# Patient Record
Sex: Female | Born: 1970
Health system: Southern US, Community
[De-identification: ages and names within clinical notes are randomized; demographics above are authoritative.]

## PROBLEM LIST (undated history)

## (undated) DIAGNOSIS — T7840XA Allergy, unspecified, initial encounter: Secondary | ICD-10-CM

## (undated) DIAGNOSIS — N83201 Unspecified ovarian cyst, right side: Secondary | ICD-10-CM

## (undated) DIAGNOSIS — E785 Hyperlipidemia, unspecified: Secondary | ICD-10-CM

## (undated) DIAGNOSIS — Z9889 Other specified postprocedural states: Secondary | ICD-10-CM

## (undated) DIAGNOSIS — N83202 Unspecified ovarian cyst, left side: Secondary | ICD-10-CM

## (undated) DIAGNOSIS — N200 Calculus of kidney: Secondary | ICD-10-CM

## (undated) DIAGNOSIS — F419 Anxiety disorder, unspecified: Secondary | ICD-10-CM

## (undated) DIAGNOSIS — M1990 Unspecified osteoarthritis, unspecified site: Secondary | ICD-10-CM

## (undated) DIAGNOSIS — R112 Nausea with vomiting, unspecified: Secondary | ICD-10-CM

## (undated) DIAGNOSIS — K219 Gastro-esophageal reflux disease without esophagitis: Secondary | ICD-10-CM

## (undated) DIAGNOSIS — N289 Disorder of kidney and ureter, unspecified: Secondary | ICD-10-CM

## (undated) HISTORY — PX: LEG SURGERY: SHX1003

## (undated) HISTORY — DX: Hyperlipidemia, unspecified: E78.5

## (undated) HISTORY — PX: COLONOSCOPY: SHX174

## (undated) HISTORY — DX: Unspecified osteoarthritis, unspecified site: M19.90

## (undated) HISTORY — PX: WISDOM TOOTH EXTRACTION: SHX21

## (undated) HISTORY — DX: Other specified postprocedural states: Z98.890

## (undated) HISTORY — PX: ABDOMINAL HYSTERECTOMY: SHX81

## (undated) HISTORY — DX: Gastro-esophageal reflux disease without esophagitis: K21.9

## (undated) HISTORY — DX: Allergy, unspecified, initial encounter: T78.40XA

## (undated) HISTORY — PX: ANKLE SURGERY: SHX546

## (undated) HISTORY — DX: Nausea with vomiting, unspecified: R11.2

## (undated) HISTORY — DX: Anxiety disorder, unspecified: F41.9

## (undated) HISTORY — PX: POLYPECTOMY: SHX149

---

## 2011-10-04 ENCOUNTER — Emergency Department
Admission: EM | Admit: 2011-10-04 | Discharge: 2011-10-04 | Disposition: A | Discharge status: Home / Self Care | Payer: 59 | Source: Home / Self Care | Attending: Family Medicine | Admitting: Family Medicine

## 2011-10-04 ENCOUNTER — Encounter: Payer: Self-pay | Admitting: *Deleted

## 2011-10-04 DIAGNOSIS — J069 Acute upper respiratory infection, unspecified: Secondary | ICD-10-CM

## 2011-10-04 DIAGNOSIS — J029 Acute pharyngitis, unspecified: Secondary | ICD-10-CM

## 2011-10-04 MED ORDER — AMOXICILLIN 875 MG PO TABS: 875.0000 mg | ORAL_TABLET | Freq: Two times a day (BID) | ORAL | Status: DC

## 2011-10-04 MED ORDER — BENZONATATE 200 MG PO CAPS: 200.0000 mg | ORAL_CAPSULE | Freq: Every day | ORAL | Status: DC

## 2011-10-04 NOTE — ED Provider Notes (Signed)
History     CSN: [id number]  Arrival date & time 10/04/11  5784   First MD Initiated Contact with Patient 10/04/11 [phone]      Chief Complaint  Patient presents with  . Sore Throat      HPI Comments:  Pt c/o sore throat and a little cough x last night. Denies fever. She used chloraseptic spray last night with no relief.    The history is provided by the patient.    History reviewed. No pertinent past medical history.  Past Surgical History  Procedure Date  . Abdominal hysterectomy   . Leg surgery     LT    Family History  Problem Relation Age of Onset  . Diabetes Mother   . Hypertension Mother   . COPD Mother   . Hypertension Brother     History  Substance Use Topics  . Smoking status: Never Smoker   . Smokeless tobacco: Not on file  . Alcohol Use: No    OB History    Grav Para Term Preterm Abortions TAB SAB Ect Mult Living                  Review of Systems + sore throat + cough No pleuritic pain No wheezing No nasal congestion No post-nasal drainage No sinus pain/pressure No itchy/red eyes No earache No hemoptysis No SOB No fever, + chills No nausea No vomiting No abdominal pain No diarrhea No urinary symptoms No skin rashes + fatigue No myalgias + headache Used OTC meds without relief  Allergies  Review of patient's allergies indicates no known allergies.  Home Medications   Current Outpatient Rx  Name Route Sig Dispense Refill  . AMOXICILLIN 875 MG PO TABS Oral Take 1 tablet (875 mg total) by mouth 2 (two) times daily. (Rx void after 10/12/11) 20 tablet 0  . BENZONATATE 200 MG PO CAPS Oral Take 1 capsule (200 mg total) by mouth at bedtime. Take as needed for cough 12 capsule 0    BP 127/85  Pulse 78  Temp 97.9 F (36.6 C) (Oral)  Resp 16  Ht 5\' 2"  (1.575 m)  Wt 189 lb (85.73 kg)  BMI 34.57 kg/m2  SpO2 99%  Physical Exam Nursing notes and Vital Signs reviewed. Appearance:  Patient appears healthy, stated age, and in no  acute distress Eyes:  Pupils are equal, round, and reactive to light and accomodation.  Extraocular movement is intact.  Conjunctivae are not inflamed  Ears:  Canals normal.  Tympanic membranes normal.  Nose:  Mildly congested turbinates.  No sinus tenderness.   Pharynx:  Minimal erythema Neck:  Supple.  Tender shotty posterior nodes are palpated bilaterally  Lungs:  Clear to auscultation.  Breath sounds are equal.  Heart:  Regular rate and rhythm without murmurs, rubs, or gallops.  Abdomen:  Nontender without masses or hepatosplenomegaly.  Bowel sounds are present.  No CVA or flank tenderness.  Extremities:  No edema.  No calf tenderness Skin:  No rash present.   ED Course  Procedures  none   Labs Reviewed  POCT RAPID STREP A (OFFICE) negative  STREP A DNA PROBE pending      1. Acute pharyngitis   2. Acute upper respiratory infections of unspecified site:  Suspect viral syndrome.           MDM  There is no evidence of bacterial infection today. Throat culture pending   Treat symptomatically for now  Prescription written for Benzonatate [facility]) to take  at bedtime for night-time cough.  Take Mucinex D (guaifenesin with decongestant) twice daily for congestion.  Increase fluid intake, rest. May use Afrin nasal spray (or generic oxymetazoline) twice daily for about 5 days.  Also recommend using saline nasal spray several times daily and saline nasal irrigation (AYR is a common brand) Stop all antihistamines for now, and other non-prescription cough/cold preparations. May take Ibuprofen 200mg , 4 tabs every 8 hours with food for sore throat, headache, etc. Begin Amoxicillin if not improving about 5 to 7 days or if persistent fever develops (Given a prescription to hold, with an expiration date)  Follow-up with family doctor if not improving 7 to 10 days.        Lattie Haw, MD 10/04/11 [phone]

## 2011-10-04 NOTE — ED Notes (Signed)
Pt c/o sore throat and a little cough x last night. Denies fever. She used chloraseptic spray last night with no relief.

## 2011-10-06 ENCOUNTER — Telehealth: Payer: Self-pay | Admitting: Family Medicine

## 2012-01-22 ENCOUNTER — Encounter: Payer: Self-pay | Admitting: *Deleted

## 2012-01-22 ENCOUNTER — Emergency Department
Admission: EM | Admit: 2012-01-22 | Discharge: 2012-01-22 | Disposition: A | Discharge status: Home / Self Care | Payer: 59 | Source: Home / Self Care | Attending: Family Medicine | Admitting: Family Medicine

## 2012-01-22 DIAGNOSIS — J069 Acute upper respiratory infection, unspecified: Secondary | ICD-10-CM

## 2012-01-22 DIAGNOSIS — M940 Chondrocostal junction syndrome [Tietze]: Secondary | ICD-10-CM

## 2012-01-22 MED ORDER — AZITHROMYCIN 250 MG PO TABS: ORAL_TABLET | ORAL | Status: DC

## 2012-01-22 MED ORDER — BENZONATATE 200 MG PO CAPS: 200.0000 mg | ORAL_CAPSULE | Freq: Every day | ORAL | Status: DC

## 2012-01-22 NOTE — ED Notes (Signed)
Patient c/o dry cough, body aches and ear pain x 3 days. Taken Nyquil. No flu vaccine this year.

## 2012-01-22 NOTE — ED Provider Notes (Signed)
History     CSN: [id number]  Arrival date & time 01/22/12  1057   First MD Initiated Contact with Patient 01/22/12 1113      Chief Complaint  Patient presents with  . Cough     HPI Comments: Patient complains of mild URI symptoms for 5 days.   He has not had influenza immunization for this season.  She also has not had a recent Tdap (about 11 years ago) and works in a [employer].    The history is provided by the patient.    History reviewed. No pertinent past medical history.  Past Surgical History  Procedure Date  . Abdominal hysterectomy   . Leg surgery     LT    Family History  Problem Relation Age of Onset  . Diabetes Mother   . Hypertension Mother   . COPD Mother   . Hypertension Brother     History  Substance Use Topics  . Smoking status: Never Smoker   . Smokeless tobacco: Not on file  . Alcohol Use: No    OB History    Grav Para Term Preterm Abortions TAB SAB Ect Mult Living                  Review of Systems No sore throat + cough No pleuritic pain No wheezing + nasal congestion + post-nasal drainage No sinus pain/pressure No itchy/red eyes ? earache No hemoptysis No SOB No fever/chills No nausea No vomiting No abdominal pain No diarrhea No urinary symptoms No skin rashes + fatigue + mild myalgias + headache Used OTC meds without relief  Allergies  Review of patient's allergies indicates no known allergies.  Home Medications   Current Outpatient Rx  Name  Route  Sig  Dispense  Refill  . AZITHROMYCIN 250 MG PO TABS      Take 2 tabs today; then begin one tab once daily for 4 more days. (Rx void after 01/30/12)   6 each   0   . BENZONATATE 200 MG PO CAPS   Oral   Take 1 capsule (200 mg total) by mouth at bedtime. Take as needed for cough   12 capsule   0     BP 126/85  Pulse 92  Temp 98.3 F (36.8 C) (Oral)  Resp 16  Ht 5\' 2"  (1.575 m)  Wt 187 lb (84.823 kg)  BMI 34.20 kg/m2  SpO2 99%  Physical Exam Nursing  notes and Vital Signs reviewed. Appearance:  Patient appears stated age, and in no acute distress.  Patient is obese (BMI 34.2) Eyes:  Pupils are equal, round, and reactive to light and accomodation.  Extraocular movement is intact.  Conjunctivae are not inflamed  Ears:  Canals normal.  Tympanic membranes normal.  Nose:  Mildly congested turbinates.  No sinus tenderness.  Pharynx:  Normal Neck:  Supple.  Slightly tender shotty posterior nodes are palpated bilaterally  Lungs:  Clear to auscultation.  Breath sounds are equal.  Chest:  Distinct tenderness to palpation over the mid-sternum.  Heart:  Regular rate and rhythm without murmurs, rubs, or gallops.  Abdomen:  Nontender without masses or hepatosplenomegaly.  Bowel sounds are present.  No CVA or flank tenderness.  Extremities:  No edema.  No calf tenderness Skin:  No rash present.   ED Course  Procedures none      1. Acute upper respiratory infections of unspecified site; suspect viral URI   2. Costochondritis, acute  MDM  There is no evidence of bacterial infection today.   Treat symptomatically for now  Prescription written for Benzonatate (Tessalon) to take at bedtime for night-time cough.  Take Mucinex D (guaifenesin with decongestant) twice daily for congestion.  Increase fluid intake, rest. May use Afrin nasal spray (or generic oxymetazoline) twice daily for about 5 days.  Also recommend using saline nasal spray several times daily and saline nasal irrigation (AYR is a common brand) Stop all antihistamines for now, and other non-prescription cough/cold preparations. May take Ibuprofen 200mg , 4 tabs every 8 hours with food for chest/sternum discomfort, body aches, etc. Begin Azithromycin if not improving about 5 days or if persistent fever develops (Given a prescription to hold, with an expiration date)  Follow-up with family doctor if not improving 7 to 10 days. Recommend a Tdap when well.  Recommend a flu shot when  well.          Lattie Haw, MD 01/22/12 [phone]

## 2012-10-01 ENCOUNTER — Emergency Department
Admission: EM | Admit: 2012-10-01 | Discharge: 2012-10-01 | Disposition: A | Discharge status: Home / Self Care | Payer: BC Managed Care – PPO | Source: Home / Self Care | Attending: Emergency Medicine | Admitting: Emergency Medicine

## 2012-10-01 ENCOUNTER — Encounter: Payer: Self-pay | Admitting: Emergency Medicine

## 2012-10-01 DIAGNOSIS — R300 Dysuria: Secondary | ICD-10-CM

## 2012-10-01 HISTORY — DX: Disorder of kidney and ureter, unspecified: N28.9

## 2012-10-01 HISTORY — DX: Unspecified ovarian cyst, right side: N83.201

## 2012-10-01 HISTORY — DX: Unspecified ovarian cyst, left side: N83.202

## 2012-10-01 HISTORY — DX: Calculus of kidney: N20.0

## 2012-10-01 LAB — POCT URINALYSIS DIP (MANUAL ENTRY)
Bilirubin, UA: NEGATIVE
Glucose, UA: NEGATIVE
Ketones, POC UA: NEGATIVE
Spec Grav, UA: 1.015 (ref 1.005–1.03)
Urobilinogen, UA: 0.2 (ref 0–1)

## 2012-10-01 MED ORDER — CIPROFLOXACIN HCL 500 MG PO TABS: 500.0000 mg | ORAL_TABLET | Freq: Two times a day (BID) | ORAL | Status: DC

## 2012-10-01 NOTE — ED Notes (Signed)
Reports intermittent right lower pelvic pain x 5 days; worse when urinating or having bowel movement. History of both kidney stones and ovarian cysts.

## 2012-10-01 NOTE — ED Provider Notes (Signed)
CSN: [id number]     Arrival date & time 10/01/12  1613 History   First MD Initiated Contact with Patient 10/01/12 1624     Chief Complaint  Patient presents with  . Pelvic Pain   (Consider location/radiation/quality/duration/timing/severity/associated sxs/prior Treatment) HPI  42yo WF with right pelvic pain for last 5 days.  She states that when she is going to the bathroom, it tends to be worse, but can happen at other times as well.  Tends to be very sharp and severe intermittently, but other times not hurting at all.  She has had multiple kidney stones in the remote past which were all in the right side but with these she had more back pain.  No dysuria hematuria polyuria, nausea vomiting diarrhea.  She had a hysterectomy in the past but still has her ovaries and her appendix.  Not taking any medicines.  Past Medical History  Diagnosis Date  . Renal disorder   . Renal calculus   . Bilateral ovarian cysts    Past Surgical History  Procedure Laterality Date  . Abdominal hysterectomy    . Leg surgery      LT   Family History  Problem Relation Age of Onset  . Diabetes Mother   . Hypertension Mother   . COPD Mother   . Hypertension Brother    History  Substance Use Topics  . Smoking status: Never Smoker   . Smokeless tobacco: Not on file  . Alcohol Use: No   OB History   Grav Para Term Preterm Abortions TAB SAB Ect Mult Living                 Review of Systems  All other systems reviewed and are negative.    Allergies  Review of patient's allergies indicates no known allergies.  Home Medications   Current Outpatient Rx  Name  Route  Sig  Dispense  Refill  . azithromycin (ZITHROMAX Z-PAK) 250 MG tablet      Take 2 tabs today; then begin one tab once daily for 4 more days. (Rx void after 01/30/12)   6 each   0   . benzonatate (TESSALON) 200 MG capsule   Oral   Take 1 capsule (200 mg total) by mouth at bedtime. Take as needed for cough   12 capsule   0   .  ciprofloxacin (CIPRO) 500 MG tablet   Oral   Take 1 tablet (500 mg total) by mouth 2 (two) times daily.   10 tablet   0    BP 128/81  Pulse 96  Temp(Src) 97.7 F (36.5 C) (Oral)  Resp 16  Ht 5\' 2"  (1.575 m)  Wt 185 lb (83.915 kg)  BMI 33.83 kg/m2  SpO2 100% Physical Exam  Nursing note and vitals reviewed. Constitutional: She is oriented to person, place, and time. She appears well-developed and well-nourished.  HENT:  Head: Normocephalic and atraumatic.  Eyes: No scleral icterus.  Neck: Neck supple.  Cardiovascular: Regular rhythm and normal heart sounds.   Pulmonary/Chest: Effort normal and breath sounds normal. No respiratory distress.  Abdominal: Soft. Normal appearance and bowel sounds are normal. She exhibits no mass. There is no tenderness. There is no rebound, no guarding and no CVA tenderness.  Neurological: She is alert and oriented to person, place, and time.  Skin: Skin is warm and dry.  Psychiatric: She has a normal mood and affect. Her speech is normal.    ED Course  Procedures (including critical care  time) Labs Review Labs Reviewed  POCT URINALYSIS DIP (MANUAL ENTRY)   Imaging Review No results found.  MDM   1. Dysuria    Her symptoms are consistent with a kidney stones.  I gave her some mesh that she can P. through and see if she can catch any stones or sand.  Also gave her prescription for Cipro that she can hold now as I do not see any evidence of infection on her UA.  In fact.  Urinalysis was completely normal with not even trace blood.  Her exam and symptoms are not consistent with appendicitis.  Differential diagnosis also includes groin strain versus other GU abnormality such as ovarian cyst.  I suggested possible imaging, especially a CT abdomen and pelvis to rule out these abnormalities, however patient would like to hold off on that for now.  I understand especially because she is having minimal to no pain while sitting here in clinic.  However if  worsening pain or new symptoms then this would be my suggestion as it would rule out most serious abnormalities such as appendicitis, ovarian cysts, nephrolithiasis, scar tissue, neoplasm.  Patient understands and agrees to this plan and will continue to drink a lot of fluid and use to screen.  We will call patient back in a few days to check on her status.  Marlaine Hind, MD 10/01/12 [phone]

## 2012-10-04 ENCOUNTER — Telehealth: Payer: Self-pay | Admitting: Emergency Medicine

## 2014-03-14 ENCOUNTER — Ambulatory Visit (INDEPENDENT_AMBULATORY_CARE_PROVIDER_SITE_OTHER): Payer: BLUE CROSS/BLUE SHIELD | Admitting: Physician Assistant

## 2014-03-14 ENCOUNTER — Encounter: Payer: Self-pay | Admitting: Physician Assistant

## 2014-03-14 VITALS — BP 122/78 | HR 81 | Ht 62.0 in | Wt 192.0 lb

## 2014-03-14 DIAGNOSIS — Z131 Encounter for screening for diabetes mellitus: Secondary | ICD-10-CM | POA: Diagnosis not present

## 2014-03-14 DIAGNOSIS — F411 Generalized anxiety disorder: Secondary | ICD-10-CM | POA: Diagnosis not present

## 2014-03-14 DIAGNOSIS — F329 Major depressive disorder, single episode, unspecified: Secondary | ICD-10-CM

## 2014-03-14 DIAGNOSIS — Z1239 Encounter for other screening for malignant neoplasm of breast: Secondary | ICD-10-CM

## 2014-03-14 DIAGNOSIS — Z13220 Encounter for screening for lipoid disorders: Secondary | ICD-10-CM | POA: Diagnosis not present

## 2014-03-14 DIAGNOSIS — F32A Depression, unspecified: Secondary | ICD-10-CM

## 2014-03-14 MED ORDER — CITALOPRAM HYDROBROMIDE 10 MG PO TABS: 10.0000 mg | ORAL_TABLET | Freq: Every day | ORAL | Status: DC

## 2014-03-14 NOTE — Progress Notes (Signed)
   Subjective:    Patient ID: Shirley Taylor, female    DOB: August 27, 1970, 44 y.o.   MRN: [id number]  HPI Pt is a 44 yo female who presents to the clinic to establish care. She would like a complete physical but today work in a focus on her anxiety and depression.  .. Active Ambulatory Problems    Diagnosis Date Noted  . Depression 03/14/2014  . Generalized anxiety disorder 03/14/2014   Resolved Ambulatory Problems    Diagnosis Date Noted  . No Resolved Ambulatory Problems   Past Medical History  Diagnosis Date  . Renal disorder   . Renal calculus   . Bilateral ovarian cysts    .Marland Kitchen Family History  Problem Relation Age of Onset  . Diabetes Mother   . Hypertension Mother   . COPD Mother   . Cancer Mother     lung  . Depression Mother   . Hyperlipidemia Mother   . Hypertension Brother   . Alcohol abuse Father    .Marland Kitchen History   Social History  . Marital Status: Married    Spouse Name: N/A  . Number of Children: N/A  . Years of Education: N/A   Occupational History  . Not on file.   Social History Main Topics  . Smoking status: Never Smoker   . Smokeless tobacco: Not on file  . Alcohol Use: Yes  . Drug Use: No  . Sexual Activity: Yes   Other Topics Concern  . Not on file   Social History Narrative   Patient has had arrest 2 years. Her mother died approximately a year ago. Her son has been having a lot of difficulty in school and with his mood. She has decided to homeschool him. He has sudden burst of anger that concern her and make her really frustrated. She's having a lot of difficulty sleeping. She tends to overeat and has little energy. She does admit to feeling down and crying easily. She certainly has notice her worry increase. She denies any suicidal or homicidal thoughts.   Review of Systems  All other systems reviewed and are negative.      Objective:   Physical Exam  Constitutional: She is oriented to person, place, and time. She appears  well-developed and well-nourished.  HENT:  Head: Normocephalic and atraumatic.  Cardiovascular: Normal rate, regular rhythm and normal heart sounds.   Pulmonary/Chest: Effort normal and breath sounds normal. She has no wheezes.  Neurological: She is alert and oriented to person, place, and time.  Psychiatric: She has a normal mood and affect. Her behavior is normal.          Assessment & Plan:  GAD/depression-pH Q9 was 14 and GAD-7 was 7. Will treat with Celexa 10 mg at bedtime. Discuss potential side effects. If any worsening of anxiety or depression please call office. Will follow-up in 4-6 weeks.  I did go ahead and order a mammogram in preparation for complete physical in 4 weeks. I did also go ahead and order her fasting labs.  Elevated blood pressure-rechecked on exam today and had decreased by the end of exam 2 122/78.

## 2014-03-17 ENCOUNTER — Ambulatory Visit (INDEPENDENT_AMBULATORY_CARE_PROVIDER_SITE_OTHER): Payer: BLUE CROSS/BLUE SHIELD

## 2014-03-17 DIAGNOSIS — Z1231 Encounter for screening mammogram for malignant neoplasm of breast: Secondary | ICD-10-CM

## 2014-04-06 ENCOUNTER — Encounter: Payer: Self-pay | Admitting: Physician Assistant

## 2014-04-06 ENCOUNTER — Emergency Department
Admission: EM | Admit: 2014-04-06 | Discharge: 2014-04-06 | Discharge status: Absent without leave | Payer: BLUE CROSS/BLUE SHIELD | Source: Home / Self Care

## 2014-04-06 ENCOUNTER — Ambulatory Visit (INDEPENDENT_AMBULATORY_CARE_PROVIDER_SITE_OTHER): Payer: BLUE CROSS/BLUE SHIELD | Admitting: Physician Assistant

## 2014-04-06 VITALS — BP 127/84 | HR 82 | Wt 188.0 lb

## 2014-04-06 DIAGNOSIS — N898 Other specified noninflammatory disorders of vagina: Secondary | ICD-10-CM | POA: Diagnosis not present

## 2014-04-06 DIAGNOSIS — L298 Other pruritus: Secondary | ICD-10-CM | POA: Diagnosis not present

## 2014-04-06 NOTE — Progress Notes (Signed)
   Subjective:    Patient ID: Shirley Taylor, female    DOB: 03/08/1970, 44 y.o.   MRN: [id number]  HPI Pt is a 44 yo female who presents to the clinic with 3 days of vaginal itching and whitish/yellowish discharge. Hx of yeast and BV. No recent abx or medications. No urinary symptoms. No fever, chills, abdominal pain or flank pain. Not tried anything to make better.    Review of Systems  All other systems reviewed and are negative.      Objective:   Physical Exam  Constitutional: She is oriented to person, place, and time. She appears well-developed.  HENT:  Head: Normocephalic and atraumatic.  Cardiovascular: Normal rate, regular rhythm and normal heart sounds.   Pulmonary/Chest: Effort normal and breath sounds normal.  No CVA tenderness.   Abdominal: Soft. Bowel sounds are normal. There is no tenderness.  Neurological: She is alert and oriented to person, place, and time.  Skin: Skin is dry.  Psychiatric: She has a normal mood and affect. Her behavior is normal.          Assessment & Plan:  Vaginal discharge/itching- stat wet prep ordered. Discussed vingear bath tonight for itching. Will let her know results tomorrow.

## 2014-04-06 NOTE — Patient Instructions (Signed)

## 2014-04-07 ENCOUNTER — Other Ambulatory Visit: Payer: Self-pay | Admitting: Physician Assistant

## 2014-04-07 LAB — WET PREP, GENITAL
Clue Cells Wet Prep HPF POC: NONE SEEN
Trich, Wet Prep: NONE SEEN
WBC, Wet Prep HPF POC: NONE SEEN

## 2014-04-07 MED ORDER — FLUCONAZOLE 150 MG PO TABS: 150.0000 mg | ORAL_TABLET | Freq: Once | ORAL | Status: DC

## 2014-05-18 ENCOUNTER — Encounter: Payer: Self-pay | Admitting: Physician Assistant

## 2014-05-18 DIAGNOSIS — E785 Hyperlipidemia, unspecified: Secondary | ICD-10-CM | POA: Insufficient documentation

## 2014-05-18 LAB — COMPLETE METABOLIC PANEL WITH GFR
ALBUMIN: 4.1 g/dL (ref 3.5–5.2)
ALT: 19 U/L (ref 0–35)
AST: 19 U/L (ref 0–37)
Alkaline Phosphatase: 41 U/L (ref 39–117)
BUN: 10 mg/dL (ref 6–23)
CALCIUM: 9 mg/dL (ref 8.4–10.5)
CHLORIDE: 105 meq/L (ref 96–112)
CO2: 25 meq/L (ref 19–32)
CREATININE: 0.96 mg/dL (ref 0.50–1.10)
GFR, EST AFRICAN AMERICAN: 83 mL/min
GFR, Est Non African American: 72 mL/min
Glucose, Bld: 94 mg/dL (ref 70–99)
Potassium: 4.6 mEq/L (ref 3.5–5.3)
Sodium: 140 mEq/L (ref 135–145)
Total Bilirubin: 0.3 mg/dL (ref 0.2–1.2)
Total Protein: 7 g/dL (ref 6.0–8.3)

## 2014-05-18 LAB — LIPID PANEL
CHOL/HDL RATIO: 4.6 ratio
Cholesterol: 227 mg/dL — ABNORMAL HIGH (ref 0–200)
HDL: 49 mg/dL (ref >=46)
LDL Cholesterol: 160 mg/dL — ABNORMAL HIGH (ref 0–99)
Triglycerides: 89 mg/dL (ref <150)
VLDL: 18 mg/dL (ref 0–40)

## 2014-05-18 LAB — TSH: TSH: 1.1 u[IU]/mL (ref 0.350–4.500)

## 2014-07-14 ENCOUNTER — Encounter: Payer: Self-pay | Admitting: Emergency Medicine

## 2014-07-14 ENCOUNTER — Emergency Department
Admission: EM | Admit: 2014-07-14 | Discharge: 2014-07-14 | Disposition: A | Discharge status: Home / Self Care | Payer: BLUE CROSS/BLUE SHIELD | Source: Home / Self Care | Attending: Family Medicine | Admitting: Family Medicine

## 2014-07-14 DIAGNOSIS — L02419 Cutaneous abscess of limb, unspecified: Secondary | ICD-10-CM

## 2014-07-14 DIAGNOSIS — L03119 Cellulitis of unspecified part of limb: Secondary | ICD-10-CM | POA: Diagnosis not present

## 2014-07-14 MED ORDER — DOXYCYCLINE HYCLATE 100 MG PO CAPS: 100.0000 mg | ORAL_CAPSULE | Freq: Two times a day (BID) | ORAL | Status: DC

## 2014-07-14 NOTE — ED Notes (Signed)
Small abscess upper left thigh 2 days

## 2014-07-14 NOTE — ED Provider Notes (Signed)
CSN: [id number]     Arrival date & time 07/14/14  1551 History   None    Chief Complaint  Patient presents with  . Abscess   (Consider location/radiation/quality/duration/timing/severity/associated sxs/prior Treatment) HPI Patient is a 44 year old female presenting to urgent care with complaint of gradually worsening small abscess on her left upper medial thigh for last 2 days.  Pain is achy and sore mild to moderate in severity worse with palpation. Patient is concerned for infected ingrown hair. Denies known bug bites. Denies fever chills nausea vomiting or diarrhea. She has not tried anything for her symptoms. Denies any bleeding or discharge. Denies known allergies.  Past Medical History  Diagnosis Date  . Renal disorder   . Renal calculus   . Bilateral ovarian cysts    Past Surgical History  Procedure Laterality Date  . Abdominal hysterectomy    . Leg surgery      LT  . Ankle surgery     Family History  Problem Relation Age of Onset  . Diabetes Mother   . Hypertension Mother   . COPD Mother   . Cancer Mother     lung  . Depression Mother   . Hyperlipidemia Mother   . Hypertension Brother   . Alcohol abuse Father    History  Substance Use Topics  . Smoking status: Never Smoker   . Smokeless tobacco: Not on file  . Alcohol Use: Yes   OB History    No data available     Review of Systems  Constitutional: Negative for fever and chills.  Gastrointestinal: Negative for nausea, vomiting, abdominal pain and diarrhea.  Musculoskeletal: Positive for myalgias (at sight of redness). Negative for joint swelling and arthralgias.  Skin: Positive for color change and rash. Negative for wound.       Left medial thigh    Allergies  Review of patient's allergies indicates no known allergies.  Home Medications   Prior to Admission medications   Medication Sig Start Date End Date Taking? Authorizing Provider  citalopram (CELEXA) 10 MG tablet Take 1 tablet (10 mg total) by  mouth daily. 03/14/14   Jade L Breeback, PA-C  doxycycline (VIBRAMYCIN) 100 MG capsule Take 1 capsule (100 mg total) by mouth 2 (two) times daily. One po bid x 7 days 07/14/14   Noland Fordyce, PA-C  fluconazole (DIFLUCAN) 150 MG tablet Take 1 tablet (150 mg total) by mouth once. Repeat in 42-72 hours if symptoms persist. 04/07/14   Jade L Breeback, PA-C   BP 124/80 mmHg  Pulse 79  Temp(Src) 98.2 F (36.8 C) (Oral)  Ht 5\' 2"  (1.575 m)  Wt 185 lb (83.915 kg)  BMI 33.83 kg/m2  SpO2 99% Physical Exam  Constitutional: She is oriented to person, place, and time. She appears well-developed and well-nourished.  HENT:  Head: Normocephalic and atraumatic.  Eyes: EOM are normal.  Neck: Normal range of motion.  Cardiovascular: Normal rate.   Pulmonary/Chest: Effort normal.  Musculoskeletal: Normal range of motion.  Neurological: She is alert and oriented to person, place, and time.  Skin: Skin is warm and dry. There is erythema.  Left medial thigh: 3cm area of erythema with centralized area of 0.5 darkened erythema and fluctuance. No active bleeding or discharge.  Psychiatric: She has a normal mood and affect. Her behavior is normal.  Nursing note and vitals reviewed.   ED Course  INCISION AND DRAINAGE Date/Time: 07/14/2014 4:14 PM Performed by: Noland Fordyce Authorized by: Theone Murdoch A Consent: Verbal consent  obtained. Risks and benefits: risks, benefits and alternatives were discussed Consent given by: patient Patient understanding: patient states understanding of the procedure being performed Patient consent: the patient's understanding of the procedure matches consent given Procedure consent: procedure consent matches procedure scheduled Relevant documents: relevant documents present and verified Site marked: the operative site was marked Required items: required blood products, implants, devices, and special equipment available Patient identity confirmed: verbally with patient Time  out: Immediately prior to procedure a "time out" was called to verify the correct patient, procedure, equipment, support staff and site/side marked as required. Type: abscess Body area: lower extremity Location details: left leg Anesthesia: local infiltration Local anesthetic: topical anesthetic Patient sedated: no Scalpel size: 11 Incision type: single straight Complexity: simple Drainage: purulent and  bloody Drainage amount: scant Wound treatment: wound left open Packing material: none Patient tolerance: Patient tolerated the procedure well with no immediate complications    Labs Review Labs Reviewed - No data to display  Imaging Review No results found.   MDM   1. Cellulitis and abscess of leg    Patient is a 44 year old female presenting to ED with a small abscess 0.5 cm in size with surrounding erythema concerning for cellulitis.  Patient appears well, nontoxic. Patient is afebrile. I&D successfully perform see note above. Will start patient on 1 week of doxycycline. Home care instructions provided. Advise follow-up with PCP in one week. Return precautions provided. Pt verbalized understanding and agreement with tx plan.     Noland Fordyce, PA-C 07/14/14 1715

## 2015-02-23 ENCOUNTER — Other Ambulatory Visit: Payer: Self-pay | Admitting: Physician Assistant

## 2015-02-23 DIAGNOSIS — Z1231 Encounter for screening mammogram for malignant neoplasm of breast: Secondary | ICD-10-CM

## 2015-03-30 ENCOUNTER — Ambulatory Visit (INDEPENDENT_AMBULATORY_CARE_PROVIDER_SITE_OTHER): Payer: BLUE CROSS/BLUE SHIELD

## 2015-03-30 DIAGNOSIS — Z1231 Encounter for screening mammogram for malignant neoplasm of breast: Secondary | ICD-10-CM

## 2015-04-01 ENCOUNTER — Emergency Department (INDEPENDENT_AMBULATORY_CARE_PROVIDER_SITE_OTHER): Payer: BLUE CROSS/BLUE SHIELD

## 2015-04-01 ENCOUNTER — Emergency Department
Admission: EM | Admit: 2015-04-01 | Discharge: 2015-04-01 | Disposition: A | Discharge status: Home / Self Care | Payer: BLUE CROSS/BLUE SHIELD | Source: Home / Self Care | Attending: Family Medicine | Admitting: Family Medicine

## 2015-04-01 ENCOUNTER — Encounter: Payer: Self-pay | Admitting: Emergency Medicine

## 2015-04-01 DIAGNOSIS — S92912B Unspecified fracture of left toe(s), initial encounter for open fracture: Secondary | ICD-10-CM

## 2015-04-01 DIAGNOSIS — S92422A Displaced fracture of distal phalanx of left great toe, initial encounter for closed fracture: Secondary | ICD-10-CM

## 2015-04-01 DIAGNOSIS — Z23 Encounter for immunization: Secondary | ICD-10-CM | POA: Diagnosis not present

## 2015-04-01 DIAGNOSIS — S91209A Unspecified open wound of unspecified toe(s) with damage to nail, initial encounter: Secondary | ICD-10-CM | POA: Diagnosis not present

## 2015-04-01 DIAGNOSIS — X58XXXA Exposure to other specified factors, initial encounter: Secondary | ICD-10-CM

## 2015-04-01 MED ORDER — HYDROCODONE-ACETAMINOPHEN 5-325 MG PO TABS: 1.0000 | ORAL_TABLET | Freq: Four times a day (QID) | ORAL | Status: DC | PRN

## 2015-04-01 MED ORDER — TETANUS-DIPHTH-ACELL PERTUSSIS 5-2.5-18.5 LF-MCG/0.5 IM SUSP
0.5000 mL | Freq: Once | INTRAMUSCULAR | Status: AC
Start: 2015-04-01 — End: 2015-04-01
  Administered 2015-04-01: 0.5 mL via INTRAMUSCULAR

## 2015-04-01 MED ORDER — IBUPROFEN 600 MG PO TABS: 600.0000 mg | ORAL_TABLET | Freq: Four times a day (QID) | ORAL | Status: DC | PRN

## 2015-04-01 NOTE — ED Notes (Signed)
Pt injured left foot today, missed the curb, ripped the toenail off, took Ibuprofen.

## 2015-04-01 NOTE — ED Provider Notes (Signed)
CSN: [id number]     Arrival date & time 04/01/15  1337 History   First MD Initiated Contact with Patient 04/01/15 1358     Chief Complaint  Patient presents with  . Foot Injury   (Consider location/radiation/quality/duration/timing/severity/associated sxs/prior Treatment) HPI The pt is a 45yo female presenting to [facility] with c/o Left great toe injury about 45 minutes PTA. Pt tripped on a curb and hit her toe into the concrete ground.  She reports nearly ripping her entire toenail off.  She did take ibuprofen PTA.  Toe kept bleeding so she wrapped with a handkerchief and sock.  Bleeding controlled upon arrival. She is not on blood thinners. Pain is aching and sore, 9/10 at worst.  She reports having several surgeries including a toe fusion on the same toe.  Last surgery was several years ago.  Denise hitting her head or any other injuries from the fall today.  Past Medical History  Diagnosis Date  . Renal disorder   . Renal calculus   . Bilateral ovarian cysts    Past Surgical History  Procedure Laterality Date  . Abdominal hysterectomy    . Leg surgery      LT  . Ankle surgery     Family History  Problem Relation Age of Onset  . Diabetes Mother   . Hypertension Mother   . COPD Mother   . Cancer Mother     lung  . Depression Mother   . Hyperlipidemia Mother   . Hypertension Brother   . Alcohol abuse Father    Social History  Substance Use Topics  . Smoking status: Never Smoker   . Smokeless tobacco: None  . Alcohol Use: Yes   OB History    No data available     Review of Systems  Musculoskeletal: Positive for myalgias, joint swelling and arthralgias.       Left great toe  Skin: Positive for wound. Negative for color change.    Allergies  Review of patient's allergies indicates no known allergies.  Home Medications   Prior to Admission medications   Medication Sig Start Date End Date Taking? Authorizing Provider  doxycycline (VIBRAMYCIN) 100 MG capsule Take 1  capsule (100 mg total) by mouth 2 (two) times daily. One po bid x 7 days 04/02/15   Noland Fordyce, PA-C  HYDROcodone-acetaminophen (NORCO/VICODIN) 5-325 MG tablet Take 1 tablet by mouth every 6 (six) hours as needed. 04/01/15   Noland Fordyce, PA-C  ibuprofen (ADVIL,MOTRIN) 600 MG tablet Take 1 tablet (600 mg total) by mouth every 6 (six) hours as needed. 04/01/15   Noland Fordyce, PA-C   Meds Ordered and Administered this Visit   Medications  Tdap (BOOSTRIX) injection 0.5 mL (0.5 mLs Intramuscular Given 04/01/15 1529)    BP 146/86 mmHg  Pulse 83  Temp(Src) 97.7 F (36.5 C) (Oral)  Ht 5\' 2"  (1.575 m)  Wt 184 lb (83.462 kg)  BMI 33.65 kg/m2  SpO2 97% No data found.   Physical Exam  Constitutional: She is oriented to person, place, and time. She appears well-developed and well-nourished.  HENT:  Head: Normocephalic and atraumatic.  Eyes: EOM are normal.  Neck: Normal range of motion.  Cardiovascular: Normal rate.   Pulmonary/Chest: Effort normal.  Musculoskeletal: She exhibits edema and tenderness.  Left great toe: mild to moderate edema. Slight limitation in ROM (secondary to toe fusion?) diffuse tenderness of toe and distal aspect of first MTP. Full ROM all other toes of Left foot.  Neurological: She is  alert and oriented to person, place, and time.  Skin: Skin is warm and dry.  Left great toe: moderate edema pushing toenail up but nail matrix still in tact. No active bleeding.   Psychiatric: She has a normal mood and affect. Her behavior is normal.  Nursing note and vitals reviewed.   ED Course  Procedures (including critical care time)  Labs Review Labs Reviewed - No data to display  Imaging Review Dg Foot Complete Left  04/01/2015  CLINICAL DATA:  Pt states she tripped on a curb today and injured her left foot. Nailbed is raised on big toe with pain shooting into foot. The her EXAM: LEFT FOOT - COMPLETE 3+ VIEW COMPARISON:  None. FINDINGS: There is a fracture of the tuft  of the distal phalanx of the first digit. Fracture fragments is displaced ventrally. There is ankylosis of the interphalangeal joint of the first digit IMPRESSION: Displaced fracture of the distal tuft of the first digit. Electronically Signed   By: Suzy Bouchard M.D.   On: 04/01/2015 14:38      MDM   1. Fracture, toe, left, open, initial encounter   2. Nail avulsion of toe, initial encounter     Left great toe injury.  Open fracture- displaced fracture of distal tuft of 1st digit.  Wound thoroughly cleaned, bacitracin and pressure bandage applied. Advised pt nail may fall off, however, nail matrix still appears in tact so it should grow out normally.  Post-op shoe applied. Tdap updated today in [facility].  Pt notes she is currently on an antibiotic from her dermatologist but is unsure of name of medication and unsure of amount.  Rx: Norco and ibuprofen.   Doxycycline e-prescribed.  Advised pt if she is already on the same dose of doxycycline from her dermatologist, she does not need to take double the dose.   Encouraged to f/u with Sports Medicine in 1-2 weeks of recheck of symptoms. May return sooner for wound recheck if not improving, or signs of infection. Patient verbalized understanding and agreement with treatment plan.    Noland Fordyce, PA-C 04/02/15 1131

## 2015-04-01 NOTE — Discharge Instructions (Signed)
°  Norco/Vicodin (hydrocodone-acetaminophen) is a narcotic pain medication, do not combine these medications with others containing tylenol. While taking, do not drink alcohol, drive, or perform any other activities that requires focus while taking these medications.  ° °

## 2015-04-02 ENCOUNTER — Telehealth: Payer: Self-pay

## 2015-04-02 MED ORDER — DOXYCYCLINE HYCLATE 100 MG PO CAPS: 100.0000 mg | ORAL_CAPSULE | Freq: Two times a day (BID) | ORAL | Status: DC

## 2015-04-02 MED ORDER — CLINDAMYCIN HCL 300 MG PO CAPS: 300.0000 mg | ORAL_CAPSULE | Freq: Three times a day (TID) | ORAL | Status: DC

## 2015-04-09 ENCOUNTER — Telehealth: Payer: Self-pay | Admitting: Emergency Medicine

## 2015-04-10 ENCOUNTER — Ambulatory Visit (INDEPENDENT_AMBULATORY_CARE_PROVIDER_SITE_OTHER): Payer: BLUE CROSS/BLUE SHIELD | Admitting: Physician Assistant

## 2015-04-10 ENCOUNTER — Encounter: Payer: Self-pay | Admitting: Physician Assistant

## 2015-04-10 VITALS — BP 148/73 | HR 80 | Ht 62.0 in

## 2015-04-10 DIAGNOSIS — S91209A Unspecified open wound of unspecified toe(s) with damage to nail, initial encounter: Secondary | ICD-10-CM | POA: Insufficient documentation

## 2015-04-10 DIAGNOSIS — S91209D Unspecified open wound of unspecified toe(s) with damage to nail, subsequent encounter: Secondary | ICD-10-CM | POA: Diagnosis not present

## 2015-04-10 DIAGNOSIS — T50905A Adverse effect of unspecified drugs, medicaments and biological substances, initial encounter: Secondary | ICD-10-CM

## 2015-04-10 DIAGNOSIS — T887XXA Unspecified adverse effect of drug or medicament, initial encounter: Secondary | ICD-10-CM | POA: Diagnosis not present

## 2015-04-10 MED ORDER — TRIAMCINOLONE 0.1 % CREAM:EUCERIN CREAM 1:1: 1.0000 "application " | TOPICAL_CREAM | Freq: Three times a day (TID) | CUTANEOUS | Status: DC | PRN

## 2015-04-10 NOTE — Progress Notes (Signed)
   Subjective:    Patient ID: Shirley Taylor, female    DOB: 1970/09/11, 45 y.o.   MRN: [id number]  HPI  Pt presents to the clinic for follow up left great toe, nail avulsion, toe fracture and new rash. She was seen at urgent care at 04/01/15. She was placed on clindamycin and placed in post op shoe. Her toe seems to be healing ok but nail feels loose and like it is about to fall off but causes a lot of pain if touched. She had to stop clindamycin after 5 days due to a rash over trunk. She stopped on Saturday after 5 days of medication. Rash has persisted but not worsened. Not really itching. No problems swallowing or lip swelling. Taken a few doses of benadryl.   Review of Systems  All other systems reviewed and are negative.      Objective:   Physical Exam  Constitutional: She is oriented to person, place, and time. She appears well-developed and well-nourished.  HENT:  Head: Normocephalic and atraumatic.  Cardiovascular: Normal rate and normal heart sounds.   Pulmonary/Chest: Effort normal and breath sounds normal.  Musculoskeletal:  Left great toenail with nail split in half. hemostat's used to removed partial toenail of last half. No complications. Bandage placed in office.   Neurological: She is alert and oriented to person, place, and time.  Skin:     Psychiatric: She has a normal mood and affect. Her behavior is normal.          Assessment & Plan:  Nail avulsion of great left toe/great left toe fracture- she has had 5 days of abx and stayed on minocycline for rosacea. i do not think she needs any more abx. Removed partial toenail using hemostats. Keep covered. Continue epson salt soaks. Healing well. Follow up as needed. Continue to wear post op shoe.   Drug reaction- added clindamycin to allergy list. Do not continue to take. Continue benadryl if develops itch. Eucerin/traimcinolong cream given to use topically for rash. Consider prednisone but since symptoms are manageable  pt would like to try to not use.

## 2015-04-10 NOTE — Patient Instructions (Signed)
Drug Allergy Allergic reactions to medicines are common. Some allergic reactions are mild. A delayed type of drug allergy that occurs 1 week or more after exposure to a medicine or vaccine is called serum sickness. A life-threatening, sudden (acute) allergic reaction that involves the whole body is called anaphylaxis. CAUSES  "True" drug allergies occur when there is an allergic reaction to a medicine. This is caused by overactivity of the immune system. First, the body becomes sensitized. The immune system is triggered by your first exposure to the medicine. Following this first exposure, future exposure to the same medicine may be life-threatening. Almost any medicine can cause an allergic reaction. Common ones are:  Penicillin.  Sulfonamides (sulfa drugs).  Local anesthetics.  X-ray dyes that contain iodine. SYMPTOMS  Common symptoms of a minor allergic reaction are:  Swelling around the mouth.  An itchy red rash or hives.  Vomiting or diarrhea. Anaphylaxis can cause swelling of the mouth and throat. This makes it difficult to breathe and swallow. Severe reactions can be fatal within seconds, even after exposure to only a trace amount of the drug that causes the reaction. HOME CARE INSTRUCTIONS  If you are unsure of what caused your reaction, write down:  The names of the medicines you took.  How much medicine you took.  How you took the medicine, such as whether you took a pill, injected the medicine, or applied it to your skin.  All of the things you ate and drank.  The date and time of your reaction.  The symptoms of the reaction.  You may want to follow up with an allergy specialist after the reaction has cleared in order to be tested to confirm the allergy. It is important to confirm that your reaction is an allergy, not just a side effect to the medicine. If you have a true allergy to a medicine, this may prevent that medicine and related medicines from being given to  you when you are very ill.  If you have hives or a rash:  Take medicines as directed by your caregiver.  You may use an over-the-counter antihistamine (diphenhydramine) as needed.  Apply cold compresses to the skin or take baths in cool water. Avoid hot baths or showers.  If you are severely allergic:  Continuous observation after a severe reaction may be needed. Hospitalization is often required.  Wear a medical alert bracelet or necklace stating your allergy.  You and your family must learn how to use an anaphylaxis kit or give an epinephrine injection to temporarily treat an emergency allergic reaction. If you have had a severe reaction, always carry your epinephrine injection or anaphylaxis kit with you. This can be lifesaving if you have a severe reaction.  Do not drive or perform tasks after treatment until the medicines used to treat your reaction have worn off, or until your caregiver says it is okay.  If you have a drug allergy that was confirmed by your health care provider:  Carry information about the drug allergy with you at all times.  Always check with a pharmacist before taking any over-the-counter medicine. SEEK MEDICAL CARE IF:   You think you had an allergic reaction. Symptoms usually start within 30 minutes after exposure.  Symptoms are getting worse rather than better.  You develop new symptoms.  The symptoms that brought you to your caregiver return. SEEK IMMEDIATE MEDICAL CARE IF:   You have swelling of the mouth, difficulty breathing, or wheezing.  You have a tight  feeling in your chest or throat.  You develop hives, swelling, or itching all over your body.  You develop severe vomiting or diarrhea.  You feel faint or pass out. This is an emergency. Use your epinephrine injection or anaphylaxis kit as you have been instructed. Call for emergency medical help. Even if you improve after the injection, you need to be examined at a hospital emergency  department. MAKE SURE YOU:   Understand these instructions.  Will watch your condition.  Will get help right away if you are not doing well or get worse.   This information is not intended to replace advice given to you by your health care provider. Make sure you discuss any questions you have with your health care provider.   Document Released: 12/24/2004 Document Revised: 01/14/2014 Document Reviewed: 07/26/2014 Elsevier Interactive Patient Education 2016 Else[employer] Inc.  

## 2015-04-11 ENCOUNTER — Institutional Professional Consult (permissible substitution): Payer: BLUE CROSS/BLUE SHIELD | Admitting: Family Medicine

## 2015-04-11 ENCOUNTER — Encounter: Payer: Self-pay | Admitting: Physician Assistant

## 2016-06-19 ENCOUNTER — Other Ambulatory Visit: Payer: Self-pay | Admitting: Physician Assistant

## 2016-06-19 DIAGNOSIS — Z1239 Encounter for other screening for malignant neoplasm of breast: Secondary | ICD-10-CM

## 2016-06-25 ENCOUNTER — Ambulatory Visit (INDEPENDENT_AMBULATORY_CARE_PROVIDER_SITE_OTHER): Payer: 59

## 2016-06-25 DIAGNOSIS — Z1239 Encounter for other screening for malignant neoplasm of breast: Secondary | ICD-10-CM

## 2016-06-25 DIAGNOSIS — Z1231 Encounter for screening mammogram for malignant neoplasm of breast: Secondary | ICD-10-CM

## 2016-11-19 ENCOUNTER — Ambulatory Visit: Payer: 59 | Admitting: Physician Assistant

## 2016-11-19 ENCOUNTER — Encounter: Payer: Self-pay | Admitting: Physician Assistant

## 2016-11-19 VITALS — BP 128/86 | HR 86 | Temp 97.5°F | Wt 157.0 lb

## 2016-11-19 DIAGNOSIS — J060 Acute laryngopharyngitis: Secondary | ICD-10-CM

## 2016-11-19 LAB — POCT RAPID STREP A (OFFICE): RAPID STREP A SCREEN: NEGATIVE

## 2016-11-19 MED ORDER — AMOXICILLIN-POT CLAVULANATE 875-125 MG PO TABS: 1.0000 | ORAL_TABLET | Freq: Two times a day (BID) | ORAL | 0 refills | Status: AC

## 2016-11-19 NOTE — Patient Instructions (Addendum)
-   antibiotic twice daily for 7 days - cepacol throat lozenges - warm, salt water gargles - tylenol 1000 mg every 8 hours for throat pain, alternate with Ibuprofen 600 mg every 6 hours   Laryngitis Laryngitis is inflammation of your vocal cords. This causes hoarseness, coughing, loss of voice, sore throat, or a dry throat. Your vocal cords are two bands of muscles that are found in your throat. When you speak, these cords come together and vibrate. These vibrations come out through your mouth as sound. When your vocal cords are inflamed, your voice sounds different. Laryngitis can be temporary (acute) or long-term (chronic). Most cases of acute laryngitis improve with time. Chronic laryngitis is laryngitis that lasts for more than three weeks. What are the causes? Acute laryngitis may be caused by:  A viral infection.  Lots of talking, yelling, or singing. This is also called vocal strain.  Bacterial infections.  Chronic laryngitis may be caused by:  Vocal strain.  Injury to your vocal cords.  Acid reflux (gastroesophageal reflux disease or GERD).  Allergies.  Sinus infection.  Smoking.  Alcohol abuse.  Breathing in chemicals or dust.  Growths on the vocal cords.  What increases the risk? Risk factors for laryngitis include:  Smoking.  Alcohol abuse.  Having allergies.  What are the signs or symptoms? Symptoms of laryngitis may include:  Low, hoarse voice.  Loss of voice.  Dry cough.  Sore throat.  Stuffy nose.  How is this diagnosed? Laryngitis may be diagnosed by:  Physical exam.  Throat culture.  Blood test.  Laryngoscopy. This procedure allows your health care provider to look at your vocal cords with a mirror or viewing tube.  How is this treated? Treatment for laryngitis depends on what is causing it. Usually, treatment involves resting your voice and using medicines to soothe your throat. However, if your laryngitis is caused by a  bacterial infection, you may need to take antibiotic medicine. If your laryngitis is caused by a growth, you may need to have a procedure to remove it. Follow these instructions at home:  Drink enough fluid to keep your urine clear or pale yellow.  Breathe in moist air. Use a humidifier if you live in a dry climate.  Take medicines only as directed by your health care provider.  If you were prescribed an antibiotic medicine, finish it all even if you start to feel better.  Do not smoke cigarettes or electronic cigarettes. If you need help quitting, ask your health care provider.  Talk as little as possible. Also avoid whispering, which can cause vocal strain.  Write instead of talking. Do this until your voice is back to normal. Contact a health care provider if:  You have a fever.  You have increasing pain.  You have difficulty swallowing. Get help right away if:  You cough up blood.  You have trouble breathing. This information is not intended to replace advice given to you by your health care provider. Make sure you discuss any questions you have with your health care provider. Document Released: 12/24/2004 Document Revised: 06/01/2015 Document Reviewed: 06/08/2013 Elsevier Interactive Patient Education  [employer].

## 2016-11-19 NOTE — Progress Notes (Signed)
HPI:                                                                Shirley Taylor is a 46 y.o. female who presents to [place] today for ear pain and hoarseness  Ear Fullness   There is pain in the right ear. This is a new problem. The current episode started yesterday. The problem occurs constantly. The problem has been unchanged. There has been no fever. Associated symptoms include coughing, rhinorrhea and a sore throat. Pertinent negatives include no ear discharge, hearing loss, rash or vomiting. Associated symptoms comments: + PND. Treatments tried: netty pot. The treatment provided no relief. There is no history of a chronic ear infection or hearing loss.  Reports "head cold" beginning approximately 1 week ago, reports she was improving, reports second sickening yesterday.    Past Medical History:  Diagnosis Date  . Bilateral ovarian cysts   . Renal calculus   . Renal disorder    Past Surgical History:  Procedure Laterality Date  . ABDOMINAL HYSTERECTOMY    . ANKLE SURGERY    . LEG SURGERY     LT   Social History   Tobacco Use  . Smoking status: Never Smoker  . Smokeless tobacco: Never Used  Substance Use Topics  . Alcohol use: Yes   family history includes Alcohol abuse in her father; COPD in her mother; Cancer in her mother; Depression in her mother; Diabetes in her mother; Hyperlipidemia in her mother; Hypertension in her brother and mother.  ROS: negative except as noted in the HPI  Medications: No current outpatient medications on file.   No current facility-administered medications for this visit.    Allergies  Allergen Reactions  . Clindamycin/Lincomycin     Skin rash       Objective:  BP 128/86   Pulse 86   Temp (!) 97.5 F (36.4 C) (Oral)   Wt 157 lb (71.2 kg)   SpO2 97%   BMI 28.72 kg/m  Gen:  alert, not ill-appearing, no distress, appropriate for age HEENT: head normocephalic without  obvious abnormality, conjunctiva and cornea clear, wearing glasses, oropharynx with erythema, tonsillar exudates present, uvula midline, neck supple, there is tender tonsillar adenopathy, trachea midline Pulm: Normal work of breathing, voice is hoarse, clear to auscultation bilaterally, no wheezes, rales or rhonchi CV: Normal rate, regular rhythm, s1 and s2 distinct, no murmurs, clicks or rubs  Neuro: alert and oriented x 3, no tremor MSK: extremities atraumatic, normal gait and station Skin: intact, no rashes on exposed skin, no jaundice, no cyanosis   No flowsheet data found.   No results found for this or any previous visit (from the past 72 hour(s)). No results found.    Assessment and Plan: 46 y.o. female with   1. Acute laryngopharyngitis - POCT Strep A negative - given second sickening, will cover for bacterial pharyngitis with Augmentin - symptomatic management with analgesics, throat lozenges and salt water gargles   Patient education and anticipatory guidance given Patient agrees with treatment plan Follow-up as needed if symptoms worsen or fail to improve  Darlyne Russian PA-C

## 2017-01-20 ENCOUNTER — Encounter: Payer: Self-pay | Admitting: Physician Assistant

## 2017-01-20 ENCOUNTER — Ambulatory Visit: Payer: 59 | Admitting: Physician Assistant

## 2017-01-20 ENCOUNTER — Ambulatory Visit (INDEPENDENT_AMBULATORY_CARE_PROVIDER_SITE_OTHER): Payer: 59

## 2017-01-20 VITALS — BP 128/59 | HR 69 | Ht 62.0 in | Wt 161.0 lb

## 2017-01-20 DIAGNOSIS — M25562 Pain in left knee: Secondary | ICD-10-CM | POA: Diagnosis not present

## 2017-01-20 MED ORDER — MELOXICAM 15 MG PO TABS: 15.0000 mg | ORAL_TABLET | Freq: Every day | ORAL | 0 refills | Status: DC

## 2017-01-20 NOTE — Progress Notes (Signed)
   Subjective:    Patient ID: Shirley Taylor, female    DOB: April 09, 1970, 47 y.o.   MRN: [id number]  HPI  Pt is a 47 yo female who presents to the clinic with left knee pain that started about 1 week ago located around patella and in the lateral joint space. She describes pain as a dull ache. More painful when she put pressure on it such as kneeling and walking up stairs. Seemed to be a little better over the weekend and today. No known injury. She does row for exercise. She exercises regularly. She has been using natural oils such as deep blue opical.   .. Active Ambulatory Problems    Diagnosis Date Noted  . Depression 03/14/2014  . Generalized anxiety disorder 03/14/2014  . Hyperlipidemia 05/18/2014  . Drug reaction 04/10/2015  . Nail avulsion of toe 04/10/2015   Resolved Ambulatory Problems    Diagnosis Date Noted  . No Resolved Ambulatory Problems   Past Medical History:  Diagnosis Date  . Bilateral ovarian cysts   . Renal calculus   . Renal disorder       Review of Systems    see HPI.  Objective:   Physical Exam  Constitutional: She is oriented to person, place, and time. She appears well-developed and well-nourished.  HENT:  Head: Normocephalic and atraumatic.  Cardiovascular: Normal rate, regular rhythm and normal heart sounds.  Musculoskeletal:  Left knee pain:   No tenderness to palpation over medial or lateral joint spaces.  Some tenderness to palpation lateral side of patella.  No crepitus.  Negative mcmurrays.  Negative anterior drawer.  No swelling, redness, warmth.  No tenderness to palpation over quadriceps tendon.   Neurological: She is alert and oriented to person, place, and time.  Psychiatric: She has a normal mood and affect. Her behavior is normal.          Assessment & Plan:  Marland KitchenMarland KitchenBea was seen today for left knee pain.  Diagnoses and all orders for this visit:  Acute pain of left knee -     DG Knee 4 Views W/Patella Left -      meloxicam (MOBIC) 15 MG tablet; Take 1 tablet (15 mg total) by mouth daily.   Discussed exam and symptoms sound like patellofemoral syndrome.  Will get xray.  HO given with stretches. Consider patellar strap for next 2 weeks with mobic daily. Ice after rowing.  If not improving follow up with sports medicine.

## 2017-01-21 ENCOUNTER — Encounter: Payer: Self-pay | Admitting: Physician Assistant

## 2017-04-07 ENCOUNTER — Ambulatory Visit (HOSPITAL_BASED_OUTPATIENT_CLINIC_OR_DEPARTMENT_OTHER)
Admission: RE | Admit: 2017-04-07 | Discharge: 2017-04-07 | Disposition: A | Discharge status: Home / Self Care | Payer: 59 | Source: Ambulatory Visit | Attending: Sports Medicine | Admitting: Sports Medicine

## 2017-04-07 ENCOUNTER — Ambulatory Visit: Payer: 59 | Admitting: Sports Medicine

## 2017-04-07 ENCOUNTER — Encounter: Payer: Self-pay | Admitting: Sports Medicine

## 2017-04-07 DIAGNOSIS — N2889 Other specified disorders of kidney and ureter: Secondary | ICD-10-CM

## 2017-04-07 DIAGNOSIS — N9489 Other specified conditions associated with female genital organs and menstrual cycle: Secondary | ICD-10-CM | POA: Insufficient documentation

## 2017-04-07 DIAGNOSIS — K5730 Diverticulosis of large intestine without perforation or abscess without bleeding: Secondary | ICD-10-CM | POA: Diagnosis not present

## 2017-04-07 DIAGNOSIS — N134 Hydroureter: Secondary | ICD-10-CM | POA: Diagnosis not present

## 2017-04-07 DIAGNOSIS — R1031 Right lower quadrant pain: Secondary | ICD-10-CM | POA: Insufficient documentation

## 2017-04-07 LAB — POCT URINALYSIS DIPSTICK
Bilirubin, UA: NEGATIVE
Blood, UA: NEGATIVE
Glucose, UA: NEGATIVE
Ketones, UA: NEGATIVE
Leukocytes, UA: NEGATIVE
Nitrite, UA: NEGATIVE
Protein, UA: NEGATIVE
Spec Grav, UA: 1.01 (ref 1.010–1.025)
Urobilinogen, UA: 0.2 U/dL
pH, UA: 6.5 (ref 5.0–8.0)

## 2017-04-07 MED ORDER — IOPAMIDOL (ISOVUE-300) INJECTION 61%
100.0000 mL | Freq: Once | INTRAVENOUS | Status: AC | PRN
  Administered 2017-04-07: 100 mL via INTRAVENOUS

## 2017-04-07 NOTE — Progress Notes (Addendum)
Subjective:    CC: acute abdominal pain  HPI: This is a pleasant 47 year old female, for the past several days to a week she's had increasing abdominal pain, right lower quadrant, occasional urgency, hesitancy with urination. No blood, no constitutional symptoms. Symptoms are moderate, worsening. No nausea, vomiting, diarrhea, no constipation, no vaginal discharge. No flank pain.  I reviewed the past medical history, family history, social history, surgical history, and allergies today and no changes were needed.  Please see the problem list section below in epic for further details.  Past Medical History: Past Medical History:  Diagnosis Date  . Bilateral ovarian cysts   . Renal calculus   . Renal disorder    Past Surgical History: Past Surgical History:  Procedure Laterality Date  . ABDOMINAL HYSTERECTOMY    . ANKLE SURGERY    . LEG SURGERY     LT   Social History: Social History   Socioeconomic History  . Marital status: Married    Spouse name: Not on file  . Number of children: Not on file  . Years of education: Not on file  . Highest education level: Not on file  Occupational History  . Not on file  Social Needs  . Financial resource strain: Not on file  . Food insecurity:    Worry: Not on file    Inability: Not on file  . Transportation needs:    Medical: Not on file    Non-medical: Not on file  Tobacco Use  . Smoking status: Never Smoker  . Smokeless tobacco: Never Used  Substance and Sexual Activity  . Alcohol use: Yes  . Drug use: No  . Sexual activity: Yes  Lifestyle  . Physical activity:    Days per week: Not on file    Minutes per session: Not on file  . Stress: Not on file  Relationships  . Social connections:    Talks on phone: Not on file    Gets together: Not on file    Attends religious service: Not on file    Active member of club or organization: Not on file    Attends meetings of clubs or organizations: Not on file    Relationship  status: Not on file  Other Topics Concern  . Not on file  Social History Narrative  . Not on file   Family History: Family History  Problem Relation Age of Onset  . Diabetes Mother   . Hypertension Mother   . COPD Mother   . Cancer Mother        lung  . Depression Mother   . Hyperlipidemia Mother   . Hypertension Brother   . Alcohol abuse Father    Allergies: Allergies  Allergen Reactions  . Clindamycin/Lincomycin     Skin rash   Medications: See med rec.  Review of Systems: No fevers, chills, night sweats, weight loss, chest pain, or shortness of breath.   Objective:    General: Well Developed, well nourished, and in no acute distress.  Neuro: Alert and oriented x3, extra-ocular muscles intact, sensation grossly intact.  HEENT: Normocephalic, atraumatic, pupils equal round reactive to light, neck supple, no masses, no lymphadenopathy, thyroid nonpalpable.  Skin: Warm and dry, no rashes. Cardiac: Regular rate and rhythm, no murmurs rubs or gallops, no lower extremity edema.  Respiratory: Clear to auscultation bilaterally. Not using accessory muscles, speaking in full sentences. Abdomen: Soft, tender to palpation of the right lower quadrant with guarding, no rebound tenderness, rigidity, no bowel sounds,  no costovertebral pain, no suprapubic pain.  Urinalysis was negative.  Impression and Recommendations:    Abdominal pain, acute, right lower quadrant Acute right lower quadrant abdominal pain with a negative urinalysis, with guarding, concerning for appendicitis, either way she needs advanced imaging of her belly, labs.  CT does show a fairly large ovarian cyst, likely physiologic, this is the probable cause of her right lower quadrant pain.  These are treated conservatively, I am going to add meloxicam to use for 2 weeks.  There is some dilation of the ureter and renal pelvis on the right side, I discussed this with the radiologist, we cannot see any evidence of an  obstruction so I would like urology to weigh in here as well.  Referral placed.  Right Pelvicaliectasis There is some dilation of the ureter and renal pelvis on the right side, I discussed this with the radiologist, we cannot see any evidence of an obstruction and he does not think that large ovarian cyst is obstructing the ureter so I would like urology to weigh in here as well considering the absence of urinary tract infection.   Referral placed. ___________________________________________ Gwen Her. Dianah Field, M.D., ABFM., CAQSM. Primary Care and [place] Instructor of [place] of [facility] of Medicine

## 2017-04-07 NOTE — Assessment & Plan Note (Addendum)
Acute right lower quadrant abdominal pain with a negative urinalysis, with guarding, concerning for appendicitis, either way she needs advanced imaging of her belly, labs.  CT does show a fairly large ovarian cyst, likely physiologic, this is the probable cause of her right lower quadrant pain.  These are treated conservatively, I am going to add meloxicam to use for 2 weeks.  There is some dilation of the ureter and renal pelvis on the right side, I discussed this with the radiologist, we cannot see any evidence of an obstruction so I would like urology to weigh in here as well.  Referral placed.

## 2017-04-08 DIAGNOSIS — N2889 Other specified disorders of kidney and ureter: Secondary | ICD-10-CM | POA: Insufficient documentation

## 2017-04-08 LAB — LIPASE: Lipase: 21 U/L (ref 7–60)

## 2017-04-08 LAB — COMPREHENSIVE METABOLIC PANEL WITH GFR
AG Ratio: 1.7 (calc) (ref 1.0–2.5)
Albumin: 4.5 g/dL (ref 3.6–5.1)
BUN: 10 mg/dL (ref 7–25)
Calcium: 9.2 mg/dL (ref 8.6–10.2)
Glucose, Bld: 85 mg/dL (ref 65–99)
Potassium: 4.2 mmol/L (ref 3.5–5.3)
Total Bilirubin: 0.3 mg/dL (ref 0.2–1.2)
Total Protein: 7.1 g/dL (ref 6.1–8.1)

## 2017-04-08 LAB — COMPREHENSIVE METABOLIC PANEL
ALT: 15 U/L (ref 6–29)
AST: 16 U/L (ref 10–35)
Alkaline phosphatase (APISO): 36 U/L (ref 33–115)
CO2: 29 mmol/L (ref 20–32)
Chloride: 104 mmol/L (ref 98–110)
Creat: 0.86 mg/dL (ref 0.50–1.10)
Globulin: 2.6 g/dL (calc) (ref 1.9–3.7)
Sodium: 139 mmol/L (ref 135–146)

## 2017-04-08 LAB — CBC WITH DIFFERENTIAL/PLATELET
Basophils Absolute: 51 cells/uL (ref 0–200)
Basophils Relative: 0.5 %
Eosinophils Absolute: 121 cells/uL (ref 15–500)
Eosinophils Relative: 1.2 %
HCT: 42.6 % (ref 35.0–45.0)
Hemoglobin: 14.8 g/dL (ref 11.7–15.5)
Lymphs Abs: 3363 {cells}/uL (ref 850–3900)
MCH: 30.8 pg (ref 27.0–33.0)
MCHC: 34.7 g/dL (ref 32.0–36.0)
MCV: 88.8 fL (ref 80.0–100.0)
MPV: 10.4 fL (ref 7.5–12.5)
Monocytes Relative: 4.6 %
Neutro Abs: 6100 {cells}/uL (ref 1500–7800)
Neutrophils Relative %: 60.4 %
Platelets: 304 10*3/uL (ref 140–400)
RBC: 4.8 Million/uL (ref 3.80–5.10)
RDW: 12.5 % (ref 11.0–15.0)
Total Lymphocyte: 33.3 %
WBC mixed population: 465 cells/uL (ref 200–950)
WBC: 10.1 Thousand/uL (ref 3.8–10.8)

## 2017-04-08 LAB — AMYLASE: Amylase: 76 U/L (ref 21–101)

## 2017-04-08 LAB — URINE CULTURE
MICRO NUMBER:: 90400244
Result:: NO GROWTH
SPECIMEN QUALITY:: ADEQUATE

## 2017-04-08 MED ORDER — MELOXICAM 15 MG PO TABS: ORAL_TABLET | ORAL | 3 refills | Status: DC

## 2017-04-08 NOTE — Addendum Note (Signed)
Addended by: Silverio Decamp on: 04/08/2017 11:28 AM   Modules accepted: Orders

## 2017-04-08 NOTE — Assessment & Plan Note (Signed)
There is some dilation of the ureter and renal pelvis on the right side, I discussed this with the radiologist, we cannot see any evidence of an obstruction and he does not think that large ovarian cyst is obstructing the ureter so I would like urology to weigh in here as well considering the absence of urinary tract infection.   Referral placed.

## 2017-04-10 ENCOUNTER — Encounter: Payer: Self-pay | Admitting: Sports Medicine

## 2017-06-03 ENCOUNTER — Ambulatory Visit: Payer: 59 | Admitting: Family Medicine

## 2017-06-03 ENCOUNTER — Encounter: Payer: Self-pay | Admitting: Family Medicine

## 2017-06-03 ENCOUNTER — Ambulatory Visit (INDEPENDENT_AMBULATORY_CARE_PROVIDER_SITE_OTHER): Payer: 59

## 2017-06-03 VITALS — BP 128/72 | HR 76 | Ht 62.0 in | Wt 165.0 lb

## 2017-06-03 DIAGNOSIS — X58XXXA Exposure to other specified factors, initial encounter: Secondary | ICD-10-CM | POA: Diagnosis not present

## 2017-06-03 DIAGNOSIS — S92531A Displaced fracture of distal phalanx of right lesser toe(s), initial encounter for closed fracture: Secondary | ICD-10-CM | POA: Diagnosis not present

## 2017-06-03 DIAGNOSIS — S92911B Unspecified fracture of right toe(s), initial encounter for open fracture: Secondary | ICD-10-CM | POA: Diagnosis not present

## 2017-06-03 DIAGNOSIS — M79674 Pain in right toe(s): Secondary | ICD-10-CM | POA: Diagnosis not present

## 2017-06-03 MED ORDER — DOXYCYCLINE HYCLATE 100 MG PO TABS: 100.0000 mg | ORAL_TABLET | Freq: Two times a day (BID) | ORAL | 0 refills | Status: DC

## 2017-06-03 NOTE — Progress Notes (Addendum)
Shirley Taylor is a 47 y.o. female who presents to [facility] today for foot laceration.  Patient [place] wooden stump that cut her toe. She states that her second toe on her right foot was cut and her toe hurt significantly after. Today she states the pain has gone down significantly. She can move her toe and denies any numbness or coldness. She states she has been keeping the wound clean with antibiotic ointment.   ROS as above:  Exam:  BP 128/72   Pulse 76   Ht 5\' 2"  (1.575 m)   Wt 165 lb (74.8 kg)   BMI 30.18 kg/m  Gen: Well NAD HEENT: EOMI,  MMM Lungs: Normal work of breathing. CTABL Heart: RRR no MRG Abd: NABS, Soft. Nondistended, Nontender Exts: Brisk capillary refill, warm and well perfused.   Right second toe:  1 cm laceration at the plantar second toe at the DIP joint.  The laceration is shallow does not extend deep through the dermis.  No deep structures tendons bones are visible. Wound appears clean and non-perulent Toe is neurovascularly intact. ROM is limited due to pain   Lab and Radiology Results My personal interpretation of x-ray of the right second toe reveals a comminuted displaced fracture at the distal phalanx Awaiting formal radiology review  Assessment and Plan: 47 y.o. female with right second toe laceration and fracture.   Patient has a comminuted displaced fracture in the setting of laceration.  We will treat this like an open fracture.  Plan to start empiric doxycycline antibiotics and treat the toe with immobilization and buddy tape.  Patient is up-to-date with tetanus.  Recheck in 2 weeks.  Return sooner if needed.   Orders Placed This Encounter  Procedures  . DG Toe 2nd Right    Order Specific Question:   Reason for exam:    Answer:   impact  lacteration eval poss fracture    Order Specific Question:   Is the patient pregnant?    Answer:   No    Order Specific Question:   Preferred imaging location?    Answer:   [facility]   Meds ordered this encounter  Medications  . doxycycline (VIBRA-TABS) 100 MG tablet    Sig: Take 1 tablet (100 mg total) by mouth 2 (two) times daily.    Dispense:  14 tablet    Refill:  0     Historical information moved to improve visibility of documentation.  Past Medical History:  Diagnosis Date  . Bilateral ovarian cysts   . Renal calculus   . Renal disorder    Past Surgical History:  Procedure Laterality Date  . ABDOMINAL HYSTERECTOMY    . ANKLE SURGERY    . LEG SURGERY     LT   Social History   Tobacco Use  . Smoking status: Never Smoker  . Smokeless tobacco: Never Used  Substance Use Topics  . Alcohol use: Yes   family history includes Alcohol abuse in her father; COPD in her mother; Cancer in her mother; Depression in her mother; Diabetes in her mother; Hyperlipidemia in her mother; Hypertension in her brother and mother.  Medications: Current Outpatient Medications  Medication Sig Dispense Refill  . meloxicam (MOBIC) 15 MG tablet One tab PO qAM with breakfast for 2 weeks, then daily prn pain. 30 tablet 3  .  doxycycline (VIBRA-TABS) 100 MG tablet Take 1 tablet (100 mg total) by mouth 2 (two) times daily. 14 tablet 0   No current facility-administered medications for this visit.    Allergies  Allergen Reactions  . Clindamycin/Lincomycin     Skin rash    Health Maintenance Health Maintenance  Topic Date Due  . HIV Screening  10/25/1984  . PAP SMEAR  10/26/1990  . INFLUENZA VACCINE  09/22/2017 (Originally 08/07/2017)  . MAMMOGRAM  06/25/2017  . TETANUS/TDAP  03/31/2025    Discussed warning signs or symptoms. Please see discharge instructions. Patient expresses understanding.

## 2017-06-03 NOTE — Patient Instructions (Signed)
Thank you for coming in today. Keep the toe clean and wound covered with antibiotic ointment until the skin heals.  Treat with doxycycline twice daily for 1 week.  Do not take this medicine with calcium or iron. You can take with some food.  This medicine will make it easier to get a sunburn so cover up.   Buddy take the toes.   Recheck in 2 weeks.    How to Buddy Tape Buddy taping refers to taping an injured finger or toe to an uninjured finger or toe that is next to it. This protects the injured finger or toe and keeps it from moving while the injury heals. You may buddy tape a finger or toe if you have a minor sprain. Your health care provider may buddy tape your finger or toe if you have a sprain, dislocation, or fracture. You may be told to replace your buddy taping as needed. What are the risks? Generally, buddy taping is safe. However, problems may occur, such as:  Skin injury or infection.  Reduced blood flow to the finger or toe.  Skin reaction to the tape.  Do not buddy tape your toe if you have diabetes. Do not buddy tape if you know that you have an allergy to adhesives or surgical tape. How to buddy tape Before Buddy Taping Try to reduce any pain and swelling with rest, icing, and elevation:  Avoid any activity that causes pain.  Raise (elevate) your hand or foot above the level of your heart while you are sitting or lying down.  If directed, apply ice to the injured area: ? Put ice in a plastic bag. ? Place a towel between your skin and the bag. ? Leave the ice on for 20 minutes, 2-3 times per day.  Buddy Taping Procedure  Clean and dry your finger or toe as told by your health care provider.  Place a gauze pad or a piece of cloth or cotton between your injured finger or toe and the uninjured finger or toe.  Use tape to wrap around both fingers or toes so your injured finger or toe is secured to the uninjured finger or toe. ? The tape should be snug, but not  tight. ? Make sure the ends of the piece of tape overlap. ? Avoid placing tape directly over the joint.  Change the tape and the padding as told by your health care provider. Remove and replace the tape or padding if it becomes loose, worn, dirty, or wet. After Buddy Taping  Take over-the-counter and prescription medicines only as told by your health care provider.  Return to your normal activities as told by your health care provider. Ask your health care provider what activities are safe for you.  Watch the buddy-taped area and always remove buddy taping if: ? Your pain gets worse. ? Your fingers turn pale or blue. ? Your skin becomes irritated. Contact a health care provider if:  You have pain, swelling, or bruising that lasts longer than three days.  You have a fever.  Your skin is red, cracked, or irritated. Get help right away if:  The injured area becomes cold, numb, or pale.  You have severe pain, swelling, bruising, or loss of movement in your finger or toe.  Your finger or toe changes shape (deformity). This information is not intended to replace advice given to you by your health care provider. Make sure you discuss any questions you have with your health care provider. Document  Released: 02/01/2004 Document Revised: 06/01/2015 Document Reviewed: 05/18/2014 Elsevier Interactive Patient Education  [employer].

## 2017-06-17 ENCOUNTER — Ambulatory Visit: Payer: 59 | Admitting: Family Medicine

## 2018-01-30 ENCOUNTER — Other Ambulatory Visit: Payer: Self-pay | Admitting: Physician Assistant

## 2018-01-30 DIAGNOSIS — Z1231 Encounter for screening mammogram for malignant neoplasm of breast: Secondary | ICD-10-CM

## 2018-02-04 ENCOUNTER — Ambulatory Visit (INDEPENDENT_AMBULATORY_CARE_PROVIDER_SITE_OTHER): Payer: BLUE CROSS/BLUE SHIELD

## 2018-02-04 DIAGNOSIS — Z1231 Encounter for screening mammogram for malignant neoplasm of breast: Secondary | ICD-10-CM

## 2018-02-04 NOTE — Progress Notes (Signed)
Call pt: normal mammogram. Follow up in 1 year.

## 2018-02-23 ENCOUNTER — Ambulatory Visit: Payer: BLUE CROSS/BLUE SHIELD | Admitting: Sports Medicine

## 2018-02-23 ENCOUNTER — Encounter: Payer: Self-pay | Admitting: Sports Medicine

## 2018-02-23 DIAGNOSIS — M67431 Ganglion, right wrist: Secondary | ICD-10-CM | POA: Diagnosis not present

## 2018-02-23 NOTE — Progress Notes (Signed)
Subjective:    CC: Right wrist pain  HPI: This is a pleasant 48 year old female, for the past few months she is had increasing pain over her volar right wrist, she has noted a bit of a lump.  Symptoms are moderate, persistent, localized without radiation.  I reviewed the past medical history, family history, social history, surgical history, and allergies today and no changes were needed.  Please see the problem list section below in epic for further details.  Past Medical History: Past Medical History:  Diagnosis Date  . Bilateral ovarian cysts   . Renal calculus   . Renal disorder    Past Surgical History: Past Surgical History:  Procedure Laterality Date  . ABDOMINAL HYSTERECTOMY    . ANKLE SURGERY    . LEG SURGERY     LT   Social History: Social History   Socioeconomic History  . Marital status: Married    Spouse name: Not on file  . Number of children: Not on file  . Years of education: Not on file  . Highest education level: Not on file  Occupational History  . Not on file  Social Needs  . Financial resource strain: Not on file  . Food insecurity:    Worry: Not on file    Inability: Not on file  . Transportation needs:    Medical: Not on file    Non-medical: Not on file  Tobacco Use  . Smoking status: Never Smoker  . Smokeless tobacco: Never Used  Substance and Sexual Activity  . Alcohol use: Yes  . Drug use: No  . Sexual activity: Yes  Lifestyle  . Physical activity:    Days per week: Not on file    Minutes per session: Not on file  . Stress: Not on file  Relationships  . Social connections:    Talks on phone: Not on file    Gets together: Not on file    Attends religious service: Not on file    Active member of club or organization: Not on file    Attends meetings of clubs or organizations: Not on file    Relationship status: Not on file  Other Topics Concern  . Not on file  Social History Narrative  . Not on file   Family History: Family  History  Problem Relation Age of Onset  . Diabetes Mother   . Hypertension Mother   . COPD Mother   . Cancer Mother        lung  . Depression Mother   . Hyperlipidemia Mother   . Hypertension Brother   . Alcohol abuse Father    Allergies: Allergies  Allergen Reactions  . Clindamycin/Lincomycin     Skin rash   Medications: See med rec.  Review of Systems: No fevers, chills, night sweats, weight loss, chest pain, or shortness of breath.   Objective:    General: Well Developed, well nourished, and in no acute distress.  Neuro: Alert and oriented x3, extra-ocular muscles intact, sensation grossly intact.  HEENT: Normocephalic, atraumatic, pupils equal round reactive to light, neck supple, no masses, no lymphadenopathy, thyroid nonpalpable.  Skin: Warm and dry, no rashes. Cardiac: Regular rate and rhythm, no murmurs rubs or gallops, no lower extremity edema.  Respiratory: Clear to auscultation bilaterally. Not using accessory muscles, speaking in full sentences. Right wrist: Tender volar ganglion ROM smooth and normal with good flexion and extension and ulnar/radial deviation that is symmetrical with opposite wrist. Palpation is normal over metacarpals, navicular, lunate,  and TFCC; tendons without tenderness/ swelling No snuffbox tenderness. No tenderness over Canal of Gu[place]. Strength 5/5 in all directions without pain. Negative tinel's and phalens signs. Negative Finkelstein sign. Negative Watson's test.  Procedure: Real-time Ultrasound Guided aspiration/injection of right volar wrist ganglion Device: GE Logiq E  Verbal informed consent obtained.  Time-out conducted.  Noted no overlying erythema, induration, or other signs of local infection.  Skin prepped in a sterile fashion.  Local anesthesia: Topical Ethyl chloride.  With sterile technique and under real time ultrasound guidance: Using an 18-gauge needle aspirated a scant amount of thick fluid, syringe switched and 1/2  cc Kenalog 40-1/2 cc lidocaine injected easily Completed without difficulty  Pain immediately resolved suggesting accurate placement of the medication.  Advised to call if fevers/chills, erythema, induration, drainage, or persistent bleeding.  Images permanently stored and available for review in the ultrasound unit.  Impression: Technically successful ultrasound guided injection.  Impression and Recommendations:    Ganglion cyst of volar aspect of right wrist Aspiration and injection, return in 1 month. ___________________________________________ Gwen Her. Dianah Field, M.D., ABFM., CAQSM. Primary Care and Sports Medicine [facility]  Adjunct Professor of [place] of [facility] of Medicine

## 2018-02-23 NOTE — Assessment & Plan Note (Signed)
Aspiration and injection, return in 1 month.

## 2018-03-24 ENCOUNTER — Ambulatory Visit: Payer: BLUE CROSS/BLUE SHIELD | Admitting: Sports Medicine

## 2018-10-16 ENCOUNTER — Ambulatory Visit: Payer: BLUE CROSS/BLUE SHIELD | Admitting: Sports Medicine

## 2018-10-20 ENCOUNTER — Ambulatory Visit: Payer: BC Managed Care – PPO | Admitting: Sports Medicine

## 2018-10-20 ENCOUNTER — Other Ambulatory Visit: Payer: Self-pay

## 2018-10-20 ENCOUNTER — Encounter: Payer: Self-pay | Admitting: Sports Medicine

## 2018-10-20 DIAGNOSIS — M67431 Ganglion, right wrist: Secondary | ICD-10-CM

## 2018-10-20 NOTE — Progress Notes (Signed)
Subjective:    CC: Right wrist swelling  HPI: This is a pleasant 48 year old female, she has a history of a right volar wrist ganglion cyst, we did an aspiration and injection approximately 8 months ago.  She did well until recently, now having worsening recurrence of pain and swelling on the volar wrist, localized without radiation.  I reviewed the past medical history, family history, social history, surgical history, and allergies today and no changes were needed.  Please see the problem list section below in epic for further details.  Past Medical History: Past Medical History:  Diagnosis Date  . Bilateral ovarian cysts   . Renal calculus   . Renal disorder    Past Surgical History: Past Surgical History:  Procedure Laterality Date  . ABDOMINAL HYSTERECTOMY    . ANKLE SURGERY    . LEG SURGERY     LT   Social History: Social History   Socioeconomic History  . Marital status: Married    Spouse name: Not on file  . Number of children: Not on file  . Years of education: Not on file  . Highest education level: Not on file  Occupational History  . Not on file  Social Needs  . Financial resource strain: Not on file  . Food insecurity    Worry: Not on file    Inability: Not on file  . Transportation needs    Medical: Not on file    Non-medical: Not on file  Tobacco Use  . Smoking status: Never Smoker  . Smokeless tobacco: Never Used  Substance and Sexual Activity  . Alcohol use: Yes  . Drug use: No  . Sexual activity: Yes  Lifestyle  . Physical activity    Days per week: Not on file    Minutes per session: Not on file  . Stress: Not on file  Relationships  . Social Herbalist on phone: Not on file    Gets together: Not on file    Attends religious service: Not on file    Active member of club or organization: Not on file    Attends meetings of clubs or organizations: Not on file    Relationship status: Not on file  Other Topics Concern  . Not on  file  Social History Narrative  . Not on file   Family History: Family History  Problem Relation Age of Onset  . Diabetes Mother   . Hypertension Mother   . COPD Mother   . Cancer Mother        lung  . Depression Mother   . Hyperlipidemia Mother   . Hypertension Brother   . Alcohol abuse Father    Allergies: Allergies  Allergen Reactions  . Clindamycin/Lincomycin     Skin rash   Medications: See med rec.  Review of Systems: No fevers, chills, night sweats, weight loss, chest pain, or shortness of breath.   Objective:    General: Well Developed, well nourished, and in no acute distress.  Neuro: Alert and oriented x3, extra-ocular muscles intact, sensation grossly intact.  HEENT: Normocephalic, atraumatic, pupils equal round reactive to light, neck supple, no masses, no lymphadenopathy, thyroid nonpalpable.  Skin: Warm and dry, no rashes. Cardiac: Regular rate and rhythm, no murmurs rubs or gallops, no lower extremity edema.  Respiratory: Clear to auscultation bilaterally. Not using accessory muscles, speaking in full sentences. Right wrist: Visible volar ganglion cyst, mildly tender to palpation. ROM smooth and normal with good flexion and extension  and ulnar/radial deviation that is symmetrical with opposite wrist. Palpation is normal over metacarpals, navicular, lunate, and TFCC; tendons without tenderness/ swelling No snuffbox tenderness. No tenderness over Canal of [place]. Strength 5/5 in all directions without pain. Negative tinel's and phalens signs. Negative Finkelstein sign. Negative Watson's test.  Procedure: Real-time Ultrasound Guided injection of the right wrist volar ganglion Device: GE Logiq E  Verbal informed consent obtained.  Time-out conducted.  Noted no overlying erythema, induration, or other signs of local infection.  Skin prepped in a sterile fashion.  Local anesthesia: Topical Ethyl chloride.  With sterile technique and under real time  ultrasound guidance:  1/2 cc Kenalog 40, 1/2 cc lidocaine injected easily with a bit of fenestration. Completed without difficulty  Pain immediately resolved suggesting accurate placement of the medication.  Advised to call if fevers/chills, erythema, induration, drainage, or persistent bleeding.  Images permanently stored and available for review in the ultrasound unit.  Impression: Technically successful ultrasound guided injection.  Impression and Recommendations:    Ganglion cyst of volar aspect of right wrist Recurrence of right wrist volar ganglion. Repeat injection with fenestration. This was last performed in February 2020. Return in 1 month.   ___________________________________________ Gwen Her. Dianah Field, M.D., ABFM., CAQSM. Primary Care and Sports Medicine  [facility]  Adjunct Professor of [place] of [facility] of Medicine

## 2018-10-20 NOTE — Assessment & Plan Note (Signed)
Recurrence of right wrist volar ganglion. Repeat injection with fenestration. This was last performed in February 2020. Return in 1 month.

## 2018-11-17 ENCOUNTER — Encounter: Payer: Self-pay | Admitting: Sports Medicine

## 2018-11-17 ENCOUNTER — Ambulatory Visit (INDEPENDENT_AMBULATORY_CARE_PROVIDER_SITE_OTHER): Payer: BC Managed Care – PPO | Admitting: Sports Medicine

## 2018-11-17 ENCOUNTER — Other Ambulatory Visit: Payer: Self-pay

## 2018-11-17 DIAGNOSIS — M67431 Ganglion, right wrist: Secondary | ICD-10-CM | POA: Diagnosis not present

## 2018-11-17 NOTE — Progress Notes (Signed)
Subjective:    CC: Follow-up ganglion cyst  HPI: Shirley Taylor is a pleasant 48 year old female who had a ganglion cyst over the lateral palmar surface of her R wrist injected and fenestrated 4 weeks ago. She recently noticed a bb sized, firm, non-painful mass in the location where her cyst was. She is not bothered by the mass and does not desire any further treatment.   I reviewed the past medical history, family history, social history, surgical history, and allergies today and no changes were needed.  Please see the problem list section below in epic for further details.  Past Medical History: Past Medical History:  Diagnosis Date  . Bilateral ovarian cysts   . Renal calculus   . Renal disorder    Past Surgical History: Past Surgical History:  Procedure Laterality Date  . ABDOMINAL HYSTERECTOMY    . ANKLE SURGERY    . LEG SURGERY     LT   Social History: Social History   Socioeconomic History  . Marital status: Married    Spouse name: Not on file  . Number of children: Not on file  . Years of education: Not on file  . Highest education level: Not on file  Occupational History  . Not on file  Social Needs  . Financial resource strain: Not on file  . Food insecurity    Worry: Not on file    Inability: Not on file  . Transportation needs    Medical: Not on file    Non-medical: Not on file  Tobacco Use  . Smoking status: Never Smoker  . Smokeless tobacco: Never Used  Substance and Sexual Activity  . Alcohol use: Yes  . Drug use: No  . Sexual activity: Yes  Lifestyle  . Physical activity    Days per week: Not on file    Minutes per session: Not on file  . Stress: Not on file  Relationships  . Social Herbalist on phone: Not on file    Gets together: Not on file    Attends religious service: Not on file    Active member of club or organization: Not on file    Attends meetings of clubs or organizations: Not on file    Relationship status: Not on file   Other Topics Concern  . Not on file  Social History Narrative  . Not on file   Family History: Family History  Problem Relation Age of Onset  . Diabetes Mother   . Hypertension Mother   . COPD Mother   . Cancer Mother        lung  . Depression Mother   . Hyperlipidemia Mother   . Hypertension Brother   . Alcohol abuse Father    Allergies: Allergies  Allergen Reactions  . Clindamycin/Lincomycin     Skin rash   Medications: See med rec.  Review of Systems: No fevers, chills, night sweats, weight loss, chest pain, or shortness of breath.   Objective:    General: Well Developed, well nourished, and in no acute distress.  Neuro: Alert and oriented x3, extra-ocular muscles intact, sensation grossly intact.  HEENT: Normocephalic, atraumatic, pupils equal round reactive to light, neck supple, no masses, no lymphadenopathy, thyroid nonpalpable.  Skin: Warm and dry, no rashes. Cardiac: Regular rate and rhythm, no murmurs rubs or gallops, no lower extremity edema.  Respiratory: Clear to auscultation bilaterally. Not using accessory muscles, speaking in full sentences.  L Wrist: Small non-tender superficial ~2 mm  mass over the distal radius on palpation. 5/5 strength in both hands. Sensation intact.  A/P: Shirley Taylor presented today for follow up after a ganglion cyst over the distal radius of her R hand was fenestrated and injected. She was counseled prior to the procedure of a 50% recurrence risk. Because the mass is not painful she is comfortable with no intervention at this time.  Impression and Recommendations:    Ganglion cyst of volar aspect of right wrist Doing well after injection and fenestration at the last visit a month ago. There is a slight recurrence of the cyst, it feels like a small BB but it is nontender, because of the asymptomatic nature we will leave this alone, return as needed.   ___________________________________________ Gwen Her. Dianah Field, M.D.,  ABFM., CAQSM. Primary Care and Sports Medicine [facility]  Adjunct Professor of [employer] [facility] [employer]

## 2018-11-17 NOTE — Assessment & Plan Note (Signed)
Doing well after injection and fenestration at the last visit a month ago. There is a slight recurrence of the cyst, it feels like a small BB but it is nontender, because of the asymptomatic nature we will leave this alone, return as needed.

## 2019-03-10 ENCOUNTER — Other Ambulatory Visit: Payer: Self-pay | Admitting: Physician Assistant

## 2019-03-10 DIAGNOSIS — Z1231 Encounter for screening mammogram for malignant neoplasm of breast: Secondary | ICD-10-CM

## 2019-03-11 ENCOUNTER — Other Ambulatory Visit: Payer: Self-pay

## 2019-03-11 ENCOUNTER — Ambulatory Visit (INDEPENDENT_AMBULATORY_CARE_PROVIDER_SITE_OTHER): Payer: BC Managed Care – PPO

## 2019-03-11 DIAGNOSIS — Z1231 Encounter for screening mammogram for malignant neoplasm of breast: Secondary | ICD-10-CM

## 2019-03-15 NOTE — Progress Notes (Signed)
Shirley Taylor,   Normal mammogram. Follow up in 1 year.

## 2019-08-15 ENCOUNTER — Other Ambulatory Visit: Payer: Self-pay

## 2019-08-15 ENCOUNTER — Emergency Department
Admission: RE | Admit: 2019-08-15 | Discharge: 2019-08-15 | Disposition: A | Discharge status: Home / Self Care | Payer: BC Managed Care – PPO | Source: Ambulatory Visit

## 2019-08-15 VITALS — BP 160/78 | HR 105 | Temp 98.4°F | Resp 18

## 2019-08-15 DIAGNOSIS — Z20822 Contact with and (suspected) exposure to covid-19: Secondary | ICD-10-CM

## 2019-08-15 DIAGNOSIS — R432 Parageusia: Secondary | ICD-10-CM

## 2019-08-15 DIAGNOSIS — R430 Anosmia: Secondary | ICD-10-CM

## 2019-08-15 DIAGNOSIS — B309 Viral conjunctivitis, unspecified: Secondary | ICD-10-CM

## 2019-08-15 DIAGNOSIS — J069 Acute upper respiratory infection, unspecified: Secondary | ICD-10-CM

## 2019-08-15 MED ORDER — OLOPATADINE HCL 0.1 % OP SOLN: 1.0000 [drp] | Freq: Two times a day (BID) | OPHTHALMIC | 0 refills | Status: DC

## 2019-08-15 NOTE — ED Provider Notes (Signed)
[name]    CSN: [id number] Arrival date & time: 08/15/19  1347      History   Chief Complaint Chief Complaint  Patient presents with  . Appointment  . Covid Exposure    HPI Shirley Taylor is a 49 y.o. female.   HPI  Shirley Taylor is a 49 y.o. female presenting to UC with c/o congestion and mild cough 5 days ago, developed into loss of taste and smell the next day, fever Tmax 100*F, resolved with Tylenol.  Pt believes she has Covid. She has not been vaccinated. Pt came in today concerned about Left eye redness and irritation that she noticed this morning.  Denies drainage. No change in vision. No HA or dizziness. She does not wear glasses.  Denies known sick contacts or recent.  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    Past Medical History:  Diagnosis Date  . Bilateral ovarian cysts   . Renal calculus   . Renal disorder     Patient Active Problem List   Diagnosis Date Noted  . Ganglion cyst of volar aspect of right wrist 02/23/2018  . Open displaced fracture of phalanx of toe of right foot 06/03/2017  . Right Pelvicaliectasis 04/08/2017  . Abdominal pain, acute, right lower quadrant 04/07/2017  . Drug reaction 04/10/2015  . Hyperlipidemia 05/18/2014  . Depression 03/14/2014  . Generalized anxiety disorder 03/14/2014    Past Surgical History:  Procedure Laterality Date  . ABDOMINAL HYSTERECTOMY    . ANKLE SURGERY    . LEG SURGERY     LT    OB History   No obstetric history on file.      Home Medications    Prior to Admission medications   Medication Sig Start Date End Date Taking? Authorizing Provider  pseudoephedrine-guaifenesin (MUCINEX D) 60-600 MG 12 hr tablet Take 1 tablet by mouth every 12 (twelve) hours.   Yes [provider]  meloxicam (MOBIC) 15 MG tablet One tab PO qAM with breakfast for 2 weeks, then daily prn pain. 04/08/17   Silverio Decamp, MD  olopatadine (PATANOL) 0.1 % ophthalmic solution Place 1  drop into the left eye 2 (two) times daily. 08/15/19   Noe Gens, PA-C    Family History Family History  Problem Relation Age of Onset  . Diabetes Mother   . Hypertension Mother   . COPD Mother   . Cancer Mother        lung  . Depression Mother   . Hyperlipidemia Mother   . Hypertension Brother   . Alcohol abuse Father     Social History Social History   Tobacco Use  . Smoking status: Never Smoker  . Smokeless tobacco: Never Used  Vaping Use  . Vaping Use: Never used  Substance Use Topics  . Alcohol use: Yes  . Drug use: No     Allergies   Clindamycin/lincomycin   Review of Systems Review of Systems  Constitutional: Positive for fever. Negative for chills.  HENT: Positive for congestion and sinus pressure. Negative for ear pain, sore throat, trouble swallowing and voice change.   Eyes: Positive for redness and itching. Negative for photophobia and visual disturbance.  Respiratory: Positive for cough. Negative for shortness of breath.   Cardiovascular: Negative for chest pain and palpitations.  Gastrointestinal: Negative for abdominal pain, diarrhea, nausea and vomiting.  Musculoskeletal: Negative for arthralgias, back pain and myalgias.  Skin: Negative for rash.  Neurological: Negative for dizziness, light-headedness  and headaches.  All other systems reviewed and are negative.    Physical Exam Triage Vital Signs ED Triage Vitals  Enc Vitals Group     BP 08/15/19 1454 (!) 160/78     Pulse Rate 08/15/19 1454 (!) 105     Resp 08/15/19 1454 18     Temp 08/15/19 1454 98.4 F (36.9 C)     Temp Source 08/15/19 1454 Oral     SpO2 08/15/19 1454 98 %     Weight --      Height --      Head Circumference --      Peak Flow --      Pain Score 08/15/19 1458 0     Pain Loc --      Pain Edu? --      Excl. in [place]? --    No data found.  Updated Vital Signs BP (!) 160/78 (BP Location: Right Arm)   Pulse (!) 105   Temp 98.4 F (36.9 C) (Oral)   Resp 18    SpO2 98%   Visual Acuity Right Eye Distance:   Left Eye Distance:   Bilateral Distance:    Right Eye Near:   Left Eye Near:    Bilateral Near:     Physical Exam Vitals and nursing note reviewed.  Constitutional:      General: She is not in acute distress.    Appearance: Normal appearance. She is well-developed. She is not ill-appearing, toxic-appearing or diaphoretic.  HENT:     Head: Normocephalic and atraumatic.     Right Ear: Tympanic membrane and ear canal normal.     Left Ear: Tympanic membrane and ear canal normal.     Nose: Nose normal.     Mouth/Throat:     Lips: Pink.     Mouth: Mucous membranes are moist.     Pharynx: Oropharynx is clear. Uvula midline.  Eyes:     General: Lids are normal. Vision grossly intact. Gaze aligned appropriately.        Right eye: No discharge.        Left eye: No discharge or hordeolum.     Extraocular Movements: Extraocular movements intact.     Conjunctiva/sclera:     Left eye: Left conjunctiva is injected.     Pupils: Pupils are equal, round, and reactive to light.  Cardiovascular:     Rate and Rhythm: Normal rate and regular rhythm.  Pulmonary:     Effort: Pulmonary effort is normal. No respiratory distress.     Breath sounds: Normal breath sounds. No stridor. No wheezing, rhonchi or rales.  Musculoskeletal:        General: Normal range of motion.     Cervical back: Normal range of motion and neck supple.  Skin:    General: Skin is warm and dry.  Neurological:     Mental Status: She is alert and oriented to person, place, and time.  Psychiatric:        Behavior: Behavior normal.      UC Treatments / Results  Labs (all labs ordered are listed, but only abnormal results are displayed) Labs Reviewed  NOVEL CORONAVIRUS, NAA    EKG   Radiology No results found.  Procedures Procedures (including critical care time)  Medications Ordered in UC Medications - No data to display  Initial Impression / Assessment and  Plan / UC Course  I have reviewed the triage vital signs and the nursing notes.  Pertinent labs & imaging  results that were available during my care of the patient were reviewed by me and considered in my medical decision making (see chart for details).    Hx and exam c/w viral conjunctivitis secondary to viral URI Covid test pending Encouraged symptomatic tx and isolation for suspected Covid-19 F/u with PCP if needed Discussed symptoms that warrant emergent care in the ED. AVS given   Final Clinical Impressions(s) / UC Diagnoses   Final diagnoses:  Close exposure to COVID-19 virus  Viral URI with cough  Viral conjunctivitis of left eye  Loss of taste  Loss of smell     Discharge Instructions      Due to concern for possibly having Covid-19, it is advised that you self-isolate at home until test results come back, usually 2-3 days.  If positive, it is recommended you stay isolated for at least 10 days after symptom onset and 24 hours after last fever without taking medication (whichever is longer).  If you MUST go out, please wear a mask at all times, limit contact with others.   If your test is negative, you still have plenty of time to get the Covid vaccine to help protect yourself and those around you who are too young to get the vaccine. It is recommended you schedule an appointment to get your vaccine after you get over this current illness.  Please ask your primary care provider about any questions/concerns related to the vaccine.       ED Prescriptions    Medication Sig Dispense Auth. Provider   olopatadine (PATANOL) 0.1 % ophthalmic solution Place 1 drop into the left eye 2 (two) times daily. 5 mL Noe Gens, PA-C     PDMP not reviewed this encounter.   Noe Gens, [place] 08/16/19 1220

## 2019-08-15 NOTE — Discharge Instructions (Signed)
  Due to concern for possibly having Covid-19, it is advised that you self-isolate at home until test results come back, usually 2-3 days.  If positive, it is recommended you stay isolated for at least 10 days after symptom onset and 24 hours after last fever without taking medication (whichever is longer).  If you MUST go out, please wear a mask at all times, limit contact with others.   If your test is negative, you still have plenty of time to get the Covid vaccine to help protect yourself and those around you who are too young to get the vaccine. It is recommended you schedule an appointment to get your vaccine after you get over this current illness.  Please ask your primary care provider about any questions/concerns related to the vaccine.

## 2019-08-15 NOTE — ED Triage Notes (Signed)
Pt woke up with symptoms of COVID on Tuesday - congestion Loss of smell and taste on wed w/ Tmax of 100- resolved w/ tylenol Denies any SOB Woke up w/ red/irritated  sclera to left eye - no drainage - denies pain to left eye  Pt lives w/ her son - he has not been tested -pt states she has been in quarantine in her room  Neither patient or her son have been vaccinated( son is 50 years old)

## 2019-08-16 ENCOUNTER — Telehealth: Payer: Self-pay

## 2019-08-16 NOTE — Telephone Encounter (Signed)
Alyse Low was seen in the urgent care over the weekend. She might have covid. She feels funny/different today. I advised as below. She has been scheduled for a virtual visit tomorrow morning.   People with COVID-19 have had a wide range of symptoms reported - ranging from mild symptoms to severe illness. Symptoms may appear 2-14 days after exposure to the virus. People with these symptoms may have COVID-19: . Fever or chills . Cough . Shortness of breath or difficulty breathing . Fatigue . Muscle or body aches . Headache . New loss of taste or smell . Sore throat . Congestion or runny nose . Nausea or vomiting . Diarrhea .  When to Seek Emergency Medical Attention Look for emergency warning signs* for COVID-19. If someone is showing any of these signs, seek emergency medical care immediately . Trouble breathing . Persistent pain or pressure in the chest . New confusion . Inability to wake or stay awake . Bluish lips or face  How to self-isolate  . Use a separate room and bathroom for sick household members (if possible). Wendee Copp your hands often with soap and water for at least 20 seconds, especially after blowing your nose, coughing, or sneezing; going to the bathroom; and before eating or preparing food. . If soap and water are not readily available, use an alcohol-based hand sanitizer with at least 60% alcohol. Always wash hands with soap and water if hands are visibly dirty. . Provide your sick household member with clean disposable facemasks to wear at home, if available, to help prevent spreading COVID-19 to others. . Clean the sick room and bathroom, as needed, to avoid unnecessary contact with the sick person. Marland Kitchen Avoid sharing personal items like utensils, food, and drinks.   If you feel healthy but: . Recently had close contact with a person with COVID-19 Steps to take. Stay Home and Monitor Your Health [facility]) . Stay home until 14 days after your last  exposure. . Check your temperature twice a day and watch for symptoms of COVID-19. . If possible, stay away from people who are at higher-risk for getting very sick from COVID-19.  If you: . Have been diagnosed with COVID-19, or . Are waiting for test results, or . Have cough, fever, or shortness of breath, or other symptoms of COVID-19 Steps to take. Isolate Yourself from Others (Isolation) . Stay home until it is safe to be around others. . If you live with others, stay in a specific "sick room" or area and away from other people or animals, including pets. Use a separate bathroom, if available. . Read important information about caring for yourself or someone else who is sick, including when it's safe to end home isolation.

## 2019-08-16 NOTE — Telephone Encounter (Signed)
Saw pt called PCP, has virtual visit tomorrow. Encouraged to call back UC if needed.

## 2019-08-17 ENCOUNTER — Telehealth (INDEPENDENT_AMBULATORY_CARE_PROVIDER_SITE_OTHER): Payer: BC Managed Care – PPO | Admitting: Medical-Surgical

## 2019-08-17 ENCOUNTER — Encounter: Payer: Self-pay | Admitting: Medical-Surgical

## 2019-08-17 VITALS — HR 98 | Temp 98.3°F

## 2019-08-17 DIAGNOSIS — U071 COVID-19: Secondary | ICD-10-CM | POA: Diagnosis not present

## 2019-08-17 LAB — SARS-COV-2, NAA 2 DAY TAT

## 2019-08-17 LAB — NOVEL CORONAVIRUS, NAA: SARS-CoV-2, NAA: DETECTED — AB

## 2019-08-17 NOTE — Progress Notes (Signed)
Virtual Visit via Video Note  I connected with Shirley Taylor on 08/17/19 at  8:10 AM EDT by a video enabled telemedicine application and verified that I am speaking with the correct person using two identifiers.   I discussed the limitations of evaluation and management by telemedicine and the availability of in person appointments. The patient expressed understanding and agreed to proceed.  Patient location: home Provider locations: office  Subjective:    CC: Covid symptoms  HPI: Pleasant 49 year old female presenting today with reports of 8-9 days of upper respiratory symptoms including decreased appetite, nausea, fatigue, cough productive of clear mucus, loss of taste/smell, sinus congestion, ear fullness, runny nose, postnasal drip, and low-grade fevers T-max 100.1 using an ear thermometer.  Notes that her congestion was severe for the first couple of days but this has improved.  Also has not had any fevers since last week.  Yesterday morning, she awoke with a feeling of a hot flash that only involved her torso with some "not feeling herself" and chest heaviness.  This resolved spontaneously and did not recur during the day.  She had a similar yet milder experience this morning that has also resolved.  Her oxygen levels have been holding steady in the mid to high 90s.  She is eating and drinking okay but struggles because things do not taste right.  Has been resting when she feels tired.  Able to tolerate walking the dog several times a day and doing brief bursts of activity around the home.  She is at home alone, her husband is out of town with his mother.  Notes that she suspects her feelings yesterday morning and this morning are related to anxiety.  Has been taking Mucinex DM which has helped tremendously with her sinus congestion.  Was Covid tested on Sunday and received her results of positive today.  Did not have the Covid vaccines.  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   Past medical history, Surgical history, Family history not pertinant except as noted below, Social history, Allergies, and medications have been entered into the medical record, reviewed, and corrections made.   Review of Systems: See HPI for pertinent positives and negatives.   Objective:    General: Speaking clearly in complete sentences without any shortness of breath.  Alert and oriented x3.  Normal judgment. No apparent acute distress.  Impression and Recommendations:    1. COVID-19 virus infection Continue symptomatic care.  Reviewed recommended quarantine information.  Reviewed emergency symptoms to seek urgent care/ED for.  Advised activities that occupy her mind but allow for adequate rest to help with anxiety and boredom.  Return if symptoms worsen or fail to improve.  20 minutes of non-face-to-face time was provided during this encounter.  I discussed the assessment and treatment plan with the patient. The patient was provided an opportunity to ask questions and all were answered. The patient agreed with the plan and demonstrated an understanding of the instructions.   The patient was advised to call back or seek an in-person evaluation if the symptoms worsen or if the condition fails to improve as anticipated.  Clearnce Sorrel, DNP, APRN, FNP-BC [facility]

## 2020-03-22 ENCOUNTER — Other Ambulatory Visit: Payer: Self-pay | Admitting: Physician Assistant

## 2020-03-22 DIAGNOSIS — Z1231 Encounter for screening mammogram for malignant neoplasm of breast: Secondary | ICD-10-CM

## 2020-03-24 ENCOUNTER — Ambulatory Visit (INDEPENDENT_AMBULATORY_CARE_PROVIDER_SITE_OTHER): Payer: BC Managed Care – PPO

## 2020-03-24 ENCOUNTER — Other Ambulatory Visit: Payer: Self-pay

## 2020-03-24 DIAGNOSIS — Z1231 Encounter for screening mammogram for malignant neoplasm of breast: Secondary | ICD-10-CM | POA: Diagnosis not present

## 2020-03-27 NOTE — Progress Notes (Signed)
Normal mammogram. Follow up in 1 year.

## 2020-04-07 ENCOUNTER — Encounter: Payer: Self-pay | Admitting: Physician Assistant

## 2020-04-07 ENCOUNTER — Other Ambulatory Visit: Payer: Self-pay

## 2020-04-07 ENCOUNTER — Ambulatory Visit (INDEPENDENT_AMBULATORY_CARE_PROVIDER_SITE_OTHER): Payer: BC Managed Care – PPO | Admitting: Physician Assistant

## 2020-04-07 ENCOUNTER — Other Ambulatory Visit (HOSPITAL_COMMUNITY)
Admission: RE | Admit: 2020-04-07 | Discharge: 2020-04-07 | Disposition: A | Discharge status: Home / Self Care | Payer: BC Managed Care – PPO | Source: Ambulatory Visit | Attending: Physician Assistant | Admitting: Physician Assistant

## 2020-04-07 VITALS — BP 124/76 | HR 83 | Ht 62.0 in | Wt 181.0 lb

## 2020-04-07 DIAGNOSIS — L821 Other seborrheic keratosis: Secondary | ICD-10-CM

## 2020-04-07 DIAGNOSIS — Z124 Encounter for screening for malignant neoplasm of cervix: Secondary | ICD-10-CM | POA: Diagnosis not present

## 2020-04-07 DIAGNOSIS — Z0000 Encounter for general adult medical examination without abnormal findings: Secondary | ICD-10-CM

## 2020-04-07 DIAGNOSIS — Z131 Encounter for screening for diabetes mellitus: Secondary | ICD-10-CM

## 2020-04-07 DIAGNOSIS — Z6833 Body mass index (BMI) 33.0-33.9, adult: Secondary | ICD-10-CM

## 2020-04-07 DIAGNOSIS — Z1329 Encounter for screening for other suspected endocrine disorder: Secondary | ICD-10-CM

## 2020-04-07 DIAGNOSIS — E782 Mixed hyperlipidemia: Secondary | ICD-10-CM | POA: Diagnosis not present

## 2020-04-07 DIAGNOSIS — Z1211 Encounter for screening for malignant neoplasm of colon: Secondary | ICD-10-CM

## 2020-04-07 DIAGNOSIS — E6609 Other obesity due to excess calories: Secondary | ICD-10-CM

## 2020-04-07 NOTE — Patient Instructions (Addendum)
Health Maintenance, Female Adopting a healthy lifestyle and getting preventive care are important in promoting health and wellness. Ask your health care provider about:  The right schedule for you to have regular tests and exams.  Things you can do on your own to prevent diseases and keep yourself healthy. What should I know about diet, weight, and exercise? Eat a healthy diet  Eat a diet that includes plenty of vegetables, fruits, low-fat dairy products, and lean protein.  Do not eat a lot of foods that are high in solid fats, added sugars, or sodium.   Maintain a healthy weight Body mass index (BMI) is used to identify weight problems. It estimates body fat based on height and weight. Your health care provider can help determine your BMI and help you achieve or maintain a healthy weight. Get regular exercise Get regular exercise. This is one of the most important things you can do for your health. Most adults should:  Exercise for at least 150 minutes each week. The exercise should increase your heart rate and make you sweat (moderate-intensity exercise).  Do strengthening exercises at least twice a week. This is in addition to the moderate-intensity exercise.  Spend less time sitting. Even light physical activity can be beneficial. Watch cholesterol and blood lipids Have your blood tested for lipids and cholesterol at 51 years of age, then have this test every 5 years. Have your cholesterol levels checked more often if:  Your lipid or cholesterol levels are high.  You are older than 51 years of age.  You are at high risk for heart disease. What should I know about cancer screening? Depending on your health history and family history, you may need to have cancer screening at various ages. This may include screening for:  Breast cancer.  Cervical cancer.  Colorectal cancer.  Skin cancer.  Lung cancer. What should I know about heart disease, diabetes, and high blood  pressure? Blood pressure and heart disease  High blood pressure causes heart disease and increases the risk of stroke. This is more likely to develop in people who have high blood pressure readings, are of African descent, or are overweight.  Have your blood pressure checked: ? Every 3-5 years if you are 18-39 years of age. ? Every year if you are 40 years old or older. Diabetes Have regular diabetes screenings. This checks your fasting blood sugar level. Have the screening done:  Once every three years after age 40 if you are at a normal weight and have a low risk for diabetes.  More often and at a younger age if you are overweight or have a high risk for diabetes. What should I know about preventing infection? Hepatitis B If you have a higher risk for hepatitis B, you should be screened for this virus. Talk with your health care provider to find out if you are at risk for hepatitis B infection. Hepatitis C Testing is recommended for:  Everyone born from 1945 through 1965.  Anyone with known risk factors for hepatitis C. Sexually transmitted infections (STIs)  Get screened for STIs, including gonorrhea and chlamydia, if: ? You are sexually active and are younger than 51 years of age. ? You are older than 51 years of age and your health care provider tells you that you are at risk for this type of infection. ? Your sexual activity has changed since you were last screened, and you are at increased risk for chlamydia or gonorrhea. Ask your health care provider   if you are at risk.  Ask your health care provider about whether you are at high risk for HIV. Your health care provider may recommend a prescription medicine to help prevent HIV infection. If you choose to take medicine to prevent HIV, you should first get tested for HIV. You should then be tested every 3 months for as long as you are taking the medicine. Pregnancy  If you are about to stop having your period (premenopausal) and  you may become pregnant, seek counseling before you get pregnant.  Take 400 to 800 micrograms (mcg) of folic acid every day if you become pregnant.  Ask for birth control (contraception) if you want to prevent pregnancy. Osteoporosis and menopause Osteoporosis is a disease in which the bones lose minerals and strength with aging. This can result in bone fractures. If you are 50 years old or older, or if you are at risk for osteoporosis and fractures, ask your health care provider if you should:  Be screened for bone loss.  Take a calcium or vitamin D supplement to lower your risk of fractures.  Be given hormone replacement therapy (HRT) to treat symptoms of menopause. Follow these instructions at home: Lifestyle  Do not use any products that contain nicotine or tobacco, such as cigarettes, e-cigarettes, and chewing tobacco. If you need help quitting, ask your health care provider.  Do not use street drugs.  Do not share needles.  Ask your health care provider for help if you need support or information about quitting drugs. Alcohol use  Do not drink alcohol if: ? Your health care provider tells you not to drink. ? You are pregnant, may be pregnant, or are planning to become pregnant.  If you drink alcohol: ? Limit how much you use to 0-1 drink a day. ? Limit intake if you are breastfeeding.  Be aware of how much alcohol is in your drink. In the [place] one drink equals one 12 oz bottle of beer (355 mL), one 5 oz glass of wine (148 mL), or one 1 oz glass of hard liquor (44 mL). General instructions  Schedule regular health, dental, and eye exams.  Stay current with your vaccines.  Tell your health care provider if: ? You often feel depressed. ? You have ever been abused or do not feel safe at home. Summary  Adopting a healthy lifestyle and getting preventive care are important in promoting health and wellness.  Follow your health care provider's instructions about healthy  diet, exercising, and getting tested or screened for diseases.  Follow your health care provider's instructions on monitoring your cholesterol and blood pressure. This information is not intended to replace advice given to you by your health care provider. Make sure you discuss any questions you have with your health care provider. Document Revised: 12/17/2017 Document Reviewed: 12/17/2017 Elsevier Patient Education  2021 [employer].  Seborrheic Keratosis A seborrheic keratosis is a common, noncancerous (benign) skin growth. These growths are velvety, waxy, rough, tan, brown, or black spots that appear on the skin. These skin growths can be flat or raised, and scaly. What are the causes? The cause of this condition is not known. What increases the risk? You are more likely to develop this condition if you:  Have a family history of seborrheic keratosis.  Are 50 or older.  Are pregnant.  Have had estrogen replacement therapy. What are the signs or symptoms? Symptoms of this condition include growths on the face, chest, shoulders, back, or other areas.  These growths:  Are usually painless, but may become irritated and itchy.  Can be yellow, brown, black, or other colors.  Are slightly raised or have a flat surface.  Are sometimes rough or wart-like in texture.  Are often velvety or waxy on the surface.  Are round or oval-shaped.  Often occur in groups, but may occur as a single growth.   How is this diagnosed? This condition is diagnosed with a medical history and physical exam.  A sample of the growth may be tested (skin biopsy).  You may need to see a skin specialist (dermatologist). How is this treated? Treatment is not usually needed for this condition, unless the growths are irritated or bleed often.  You may also choose to have the growths removed if you do not like their appearance. ? Most commonly, these growths are treated with a procedure in which liquid  nitrogen is applied to "freeze" off the growth (cryosurgery). ? They may also be burned off with electricity (electrocautery) or removed by scraping (curettage). Follow these instructions at home:  Watch your growth for any changes.  Keep all follow-up visits as told by your health care provider. This is important.  Do not scratch or pick at the growth or growths. This can cause them to become irritated or infected. Contact a health care provider if:  You suddenly have many new growths.  Your growth bleeds, itches, or hurts.  Your growth suddenly becomes larger or changes color. Summary  A seborrheic keratosis is a common, noncancerous (benign) skin growth.  Treatment is not usually needed for this condition, unless the growths are irritated or bleed often.  Watch your growth for any changes.  Contact a health care provider if you suddenly have many new growths or your growth suddenly becomes larger or changes color.  Keep all follow-up visits as told by your health care provider. This is important. This information is not intended to replace advice given to you by your health care provider. Make sure you discuss any questions you have with your health care provider. Document Revised: 05/08/2017 Document Reviewed: 05/08/2017 Elsevier Patient Education  2021 [employer].

## 2020-04-07 NOTE — Progress Notes (Signed)
Subjective:     Shirley Taylor is a 50 y.o. female and is here for a comprehensive physical exam. The patient reports no problems.  Social History   Socioeconomic History  . Marital status: Married    Spouse name: Not on file  . Number of children: Not on file  . Years of education: Not on file  . Highest education level: Not on file  Occupational History  . Not on file  Tobacco Use  . Smoking status: Never Smoker  . Smokeless tobacco: Never Used  Vaping Use  . Vaping Use: Never used  Substance and Sexual Activity  . Alcohol use: Yes  . Drug use: No  . Sexual activity: Yes  Other Topics Concern  . Not on file  Social History Narrative  . Not on file   Social Determinants of Health   Financial Resource Strain: Not on file  Food Insecurity: Not on file  Transportation Needs: Not on file  Physical Activity: Not on file  Stress: Not on file  Social Connections: Not on file  Intimate Partner Violence: Not on file   Health Maintenance  Topic Date Due  . COLONOSCOPY (Pts 45-69yrs Insurance coverage will need to be confirmed)  Never done  . COVID-19 Vaccine (1) 04/23/2020 (Originally 10/26/1975)  . Hepatitis C Screening  08/16/2020 (Originally [birth year])  . HIV Screening  08/16/2020 (Originally 10/25/1984)  . INFLUENZA VACCINE  08/07/2020  . MAMMOGRAM  03/24/2021  . TETANUS/TDAP  03/31/2025  . HPV VACCINES  Aged Out    The following portions of the patient's history were reviewed and updated as appropriate: allergies, current medications, past family history, past medical history, past social history, past surgical history and problem list.  Review of Systems A comprehensive review of systems was negative.   Objective:    BP 124/76   Pulse 83   Ht 5\' 2"  (1.575 m)   Wt 181 lb (82.1 kg)   SpO2 100%   BMI 33.11 kg/m  General appearance: alert, cooperative, appears stated age and mildly obese Head: Normocephalic, without obvious abnormality, atraumatic Eyes:  conjunctivae/corneas clear. PERRL, EOM's intact. Fundi benign. Ears: normal TM's and external ear canals both ears Nose: Nares normal. Septum midline. Mucosa normal. No drainage or sinus tenderness. Throat: lips, mucosa, and tongue normal; teeth and gums normal Neck: no adenopathy, no carotid bruit, no JVD, supple, symmetrical, trachea midline and thyroid not enlarged, symmetric, no tenderness/mass/nodules Back: symmetric, no curvature. ROM normal. No CVA tenderness. Lungs: clear to auscultation bilaterally Breasts: normal appearance, no masses or tenderness Heart: regular rate and rhythm, S1, S2 normal, no murmur, click, rub or gallop Abdomen: soft, non-tender; bowel sounds normal; no masses,  no organomegaly Pelvic: external genitalia normal, no adnexal masses or tenderness, no cervical motion tenderness, rectovaginal septum normal, uterus normal size, shape, and consistency and vagina normal without discharge Extremities: extremities normal, atraumatic, no cyanosis or edema Pulses: 2+ and symmetric Skin: Skin color, texture, turgor normal. No rashes or lesions 20mm brown waxy raised macule of right temple.  Lymph nodes: Cervical, supraclavicular, and axillary nodes normal. Neurologic: Alert and oriented X 3, normal strength and tone. Normal symmetric reflexes. Normal coordination and gait    .Marland Kitchen Depression screen [facility] 2/9 04/07/2020 01/20/2017  Decreased Interest 1 0  Down, Depressed, Hopeless 1 0  PHQ - 2 Score 2 0  Altered sleeping 2 2  Tired, decreased energy 2 1  Change in appetite 1 0  Feeling bad or failure about yourself  0 0  Trouble concentrating 1 0  Moving slowly or fidgety/restless 0 0  Suicidal thoughts 0 0  PHQ-9 Score 8 3  Difficult doing work/chores Somewhat difficult -   .. GAD 7 : Generalized Anxiety Score 04/07/2020  Nervous, Anxious, on Edge 1  Control/stop worrying 0  Worry too much - different things 1  Trouble relaxing 0  Restless 0  Easily annoyed or  irritable 2  Afraid - awful might happen 0  Total GAD 7 Score 4  Anxiety Difficulty Somewhat difficult     Assessment:    Healthy female exam.      Plan:    Marland KitchenMarland KitchenKaylynne was seen today for annual exam.  Diagnoses and all orders for this visit:  Routine physical examination -     CBC with Differential/Platelet -     COMPLETE METABOLIC PANEL WITH GFR -     Lipid Panel w/reflex Direct LDL -     TSH  Mixed hyperlipidemia -     Lipid Panel w/reflex Direct LDL  Diabetes mellitus screening -     COMPLETE METABOLIC PANEL WITH GFR  Thyroid disorder screen -     TSH  Papanicolaou smear -     Cytology - PAP  Colon cancer screening -     Ambulatory referral to Gastroenterology  Class 1 obesity due to excess calories without serious comorbidity with body mass index (BMI) of 33.0 to 33.9 in adult  Seborrheic keratoses   .Marland Kitchen Discussed 150 minutes of exercise a week.  Encouraged vitamin D 1000 units and Calcium 1300mg  or 4 servings of dairy a day.  Fasting labs ordered.  Mammogram UTD.  Pap done today. Hx hysterectomy.  Colonoscopy ordered.  Declines covid/flu/shingles vaccine.  PHQ/GAD not to goal. Discussed counseling, exercise, medication. Pt declines any medication today.   Cryotherapy Procedure Note  Pre-operative Diagnosis: seb keratosis  Post-operative Diagnosis: same  Locations: right temple  Indications: irritation  Procedure Details  History of allergy to iodine: no. Pacemaker? no.  Patient informed of risks (permanent scarring, infection, light or dark discoloration, bleeding, infection, weakness, numbness and recurrence of the lesion) and benefits of the procedure and verbal informed consent obtained.  The areas are treated with liquid nitrogen therapy, frozen until ice ball extended 2 mm beyond lesion, allowed to thaw, and treated again. The patient tolerated procedure well.  The patient was instructed on post-op care, warned that there may be blister  formation, redness and pain. Recommend OTC analgesia as needed for pain.  Condition: Stable  Complications: none.  Plan: 1. Instructed to keep the area dry and covered for 24-48h and clean thereafter. 2. Warning signs of infection were reviewed.   3. Recommended that the patient use OTC acetaminophen as needed for pain.   Marland Kitchen.Discussed low carb diet with 1500 calories and 80g of protein.  Exercising at least 150 minutes a week.  [employer] could be a Microbiologist.  Discussed medication options to consider. Follow up as needed.     See After Visit Summary for Counseling Recommendations

## 2020-04-10 DIAGNOSIS — E6609 Other obesity due to excess calories: Secondary | ICD-10-CM | POA: Insufficient documentation

## 2020-04-10 DIAGNOSIS — L821 Other seborrheic keratosis: Secondary | ICD-10-CM | POA: Insufficient documentation

## 2020-04-10 LAB — CYTOLOGY - PAP
Adequacy: ABSENT
Comment: NEGATIVE
Diagnosis: NEGATIVE
High risk HPV: NEGATIVE

## 2020-04-10 NOTE — Progress Notes (Signed)
No HPV. No abnormal cells. You have had hysterectomy with normal pap. Unless having problems we do not need to do any more paps. We can continue to do a bimanuel exam yearly.

## 2020-04-11 ENCOUNTER — Encounter: Payer: Self-pay | Admitting: Physician Assistant

## 2020-04-14 DIAGNOSIS — E782 Mixed hyperlipidemia: Secondary | ICD-10-CM | POA: Diagnosis not present

## 2020-04-14 DIAGNOSIS — Z0000 Encounter for general adult medical examination without abnormal findings: Secondary | ICD-10-CM | POA: Diagnosis not present

## 2020-04-14 DIAGNOSIS — Z1329 Encounter for screening for other suspected endocrine disorder: Secondary | ICD-10-CM | POA: Diagnosis not present

## 2020-04-14 DIAGNOSIS — Z131 Encounter for screening for diabetes mellitus: Secondary | ICD-10-CM | POA: Diagnosis not present

## 2020-04-15 LAB — CBC WITH DIFFERENTIAL/PLATELET
Absolute Monocytes: 362 cells/uL (ref 200–950)
Basophils Absolute: 46 cells/uL (ref 0–200)
Basophils Relative: 0.6 %
Eosinophils Absolute: 146 cells/uL (ref 15–500)
Eosinophils Relative: 1.9 %
HCT: 45.8 % — ABNORMAL HIGH (ref 35.0–45.0)
Hemoglobin: 15.3 g/dL (ref 11.7–15.5)
Lymphs Abs: 2372 cells/uL (ref 850–3900)
MCH: 30.7 pg (ref 27.0–33.0)
MCHC: 33.4 g/dL (ref 32.0–36.0)
MCV: 92 fL (ref 80.0–100.0)
MPV: 10.5 fL (ref 7.5–12.5)
Monocytes Relative: 4.7 %
Neutro Abs: 4774 cells/uL (ref 1500–7800)
Neutrophils Relative %: 62 %
Platelets: 310 10*3/uL (ref 140–400)
RBC: 4.98 10*6/uL (ref 3.80–5.10)
RDW: 12.8 % (ref 11.0–15.0)
Total Lymphocyte: 30.8 %
WBC: 7.7 10*3/uL (ref 3.8–10.8)

## 2020-04-15 LAB — LIPID PANEL W/REFLEX DIRECT LDL
Cholesterol: 233 mg/dL — ABNORMAL HIGH (ref <200)
HDL: 53 mg/dL (ref >=50)
LDL Cholesterol (Calc): 151 mg/dL (calc) — ABNORMAL HIGH
Non-HDL Cholesterol (Calc): 180 mg/dL (calc) — ABNORMAL HIGH (ref <130)
Total CHOL/HDL Ratio: 4.4 (calc) (ref <5.0)
Triglycerides: 158 mg/dL — ABNORMAL HIGH (ref <150)

## 2020-04-15 LAB — COMPLETE METABOLIC PANEL WITH GFR
AG Ratio: 1.5 (calc) (ref 1.0–2.5)
ALT: 23 U/L (ref 6–29)
AST: 17 U/L (ref 10–35)
Albumin: 4.3 g/dL (ref 3.6–5.1)
Alkaline phosphatase (APISO): 37 U/L (ref 37–153)
BUN: 10 mg/dL (ref 7–25)
CO2: 24 mmol/L (ref 20–32)
Calcium: 9.9 mg/dL (ref 8.6–10.4)
Chloride: 109 mmol/L (ref 98–110)
Creat: 0.93 mg/dL (ref 0.50–1.05)
GFR, Est African American: 83 mL/min/{1.73_m2} (ref >=60)
GFR, Est Non African American: 72 mL/min/{1.73_m2} (ref >=60)
Globulin: 2.8 g/dL (calc) (ref 1.9–3.7)
Glucose, Bld: 97 mg/dL (ref 65–99)
Potassium: 4.4 mmol/L (ref 3.5–5.3)
Sodium: 141 mmol/L (ref 135–146)
Total Bilirubin: 0.3 mg/dL (ref 0.2–1.2)
Total Protein: 7.1 g/dL (ref 6.1–8.1)

## 2020-04-15 LAB — TSH: TSH: 1.51 mIU/L

## 2020-04-17 NOTE — Progress Notes (Signed)
Shirley Taylor,   Thyroid great.  Hemoglobin good.  Kidney, liver, glucose looks good.  Bad cholesterol is high but overall 10 year risk is still lower at 1.5 percent. Continue to make diet and lifestyle changes to reduce CV risk.

## 2020-05-01 ENCOUNTER — Encounter: Payer: Self-pay | Admitting: Gastroenterology

## 2020-05-30 ENCOUNTER — Other Ambulatory Visit: Payer: Self-pay

## 2020-05-30 ENCOUNTER — Ambulatory Visit (AMBULATORY_SURGERY_CENTER): Payer: Self-pay

## 2020-05-30 VITALS — Ht 62.0 in | Wt 186.0 lb

## 2020-05-30 DIAGNOSIS — Z1211 Encounter for screening for malignant neoplasm of colon: Secondary | ICD-10-CM

## 2020-05-30 MED ORDER — CLENPIQ 10-3.5-12 MG-GM -GM/160ML PO SOLN: 1.0000 | Freq: Once | ORAL | 0 refills | Status: AC

## 2020-05-30 NOTE — Progress Notes (Signed)
No allergies to soy or egg Pt is not on blood thinners or diet pills Denies issues with sedation/intubation Denies atrial flutter/fib Denies constipation   Pt is aware of Covid safety and care partner requirements.      

## 2020-06-16 ENCOUNTER — Ambulatory Visit (AMBULATORY_SURGERY_CENTER): Payer: BC Managed Care – PPO | Admitting: Gastroenterology

## 2020-06-16 ENCOUNTER — Encounter: Payer: Self-pay | Admitting: Gastroenterology

## 2020-06-16 ENCOUNTER — Other Ambulatory Visit: Payer: Self-pay

## 2020-06-16 VITALS — BP 158/84 | HR 73 | Temp 97.5°F | Resp 18 | Ht 62.0 in | Wt 186.0 lb

## 2020-06-16 DIAGNOSIS — D124 Benign neoplasm of descending colon: Secondary | ICD-10-CM | POA: Diagnosis not present

## 2020-06-16 DIAGNOSIS — D122 Benign neoplasm of ascending colon: Secondary | ICD-10-CM

## 2020-06-16 DIAGNOSIS — K5730 Diverticulosis of large intestine without perforation or abscess without bleeding: Secondary | ICD-10-CM

## 2020-06-16 DIAGNOSIS — K635 Polyp of colon: Secondary | ICD-10-CM | POA: Diagnosis not present

## 2020-06-16 DIAGNOSIS — Z1211 Encounter for screening for malignant neoplasm of colon: Secondary | ICD-10-CM | POA: Diagnosis not present

## 2020-06-16 DIAGNOSIS — D125 Benign neoplasm of sigmoid colon: Secondary | ICD-10-CM | POA: Diagnosis not present

## 2020-06-16 DIAGNOSIS — K388 Other specified diseases of appendix: Secondary | ICD-10-CM | POA: Diagnosis not present

## 2020-06-16 MED ORDER — SODIUM CHLORIDE 0.9 % IV SOLN: 500.0000 mL | Freq: Once | INTRAVENOUS | Status: DC

## 2020-06-16 NOTE — Progress Notes (Signed)
Pt's states no medical or surgical changes since previsit or office visit. 

## 2020-06-16 NOTE — Op Note (Signed)
[place] Patient Name: Shirley Taylor Procedure Date: 06/16/2020 1:19 PM MRN: [id number] Endoscopist: Gerrit Heck , MD Age: 50 Referring MD:  Date of Birth: July 30, 1970 Gender: Female Account #: [id number] Procedure:                Colonoscopy Indications:              Screening for colorectal malignant neoplasm, This                            is the patient's first colonoscopy Medicines:                Monitored Anesthesia Care Procedure:                Pre-Anesthesia Assessment:                           - Prior to the procedure, a History and Physical                            was performed, and patient medications and                            allergies were reviewed. The patient's tolerance of                            previous anesthesia was also reviewed. The risks                            and benefits of the procedure and the sedation                            options and risks were discussed with the patient.                            All questions were answered, and informed consent                            was obtained. Prior Anticoagulants: The patient has                            taken no previous anticoagulant or antiplatelet                            agents. ASA Grade Assessment: II - A patient with                            mild systemic disease. After reviewing the risks                            and benefits, the patient was deemed in                            satisfactory condition to undergo the procedure.  After obtaining informed consent, the colonoscope                            was passed under direct vision. Throughout the                            procedure, the patient's blood pressure, pulse, and                            oxygen saturations were monitored continuously. The                            Olympus CF-HQ190 [id number] [id number]) Colonoscope was                            introduced through the anus  and advanced to the the                            terminal ileum. The colonoscopy was performed                            without difficulty. The patient tolerated the                            procedure well. The quality of the bowel                            preparation was good. The terminal ileum, ileocecal                            valve, appendiceal orifice, and rectum were                            photographed. Scope In: 1:31:55 PM Scope Out: 2:02:36 PM Scope Withdrawal Time: 0 hours 27 minutes 4 seconds  Total Procedure Duration: 0 hours 30 minutes 41 seconds  Findings:                 The perianal and digital rectal examinations were                            normal.                           A 8 mm polyp was found in the appendiceal orifice.                            The polyp was sessile. It inially appeared to                            invade the AO, but able to manipulate the                            appendiceal tissue with the closed snare tip to  allow for full exposure of the polyp. The polyp was                            removed with a cold snare. Resection and retrieval                            were complete. There did not appear to be any                            residual polypoid tissue. Estimated blood loss was                            minimal.                           Five sessile polyps were found in the sigmoid colon                            (3), descending colon and ascending colon. The                            polyps were 2 to 5 mm in size. These polyps were                            removed with a cold snare. Resection and retrieval                            were complete. Estimated blood loss was minimal.                           A few small-mouthed diverticula were found in the                            sigmoid colon.                           The retroflexed view of the distal rectum and anal                             verge was normal and showed no anal or rectal                            abnormalities.                           The terminal ileum appeared normal. Complications:            No immediate complications. Estimated Blood Loss:     Estimated blood loss was minimal. Impression:               - One 8 mm polyp at the appendiceal orifice,                            removed with a cold snare. Resected and retrieved.                           -  Five 2 to 5 mm polyps in the sigmoid colon, in                            the descending colon and in the ascending colon,                            removed with a cold snare. Resected and retrieved.                           - Diverticulosis in the sigmoid colon.                           - The distal rectum and anal verge are normal on                            retroflexion view.                           - The examined portion of the ileum was normal. Recommendation:           - Patient has a contact number available for                            emergencies. The signs and symptoms of potential                            delayed complications were discussed with the                            patient. Return to normal activities tomorrow.                            Written discharge instructions were provided to the                            patient.                           - Resume previous diet.                           - Continue present medications.                           - Await pathology results.                           - Repeat colonoscopy for surveillance based on                            pathology results.                           - Refer to a surgeon at appointment to be scheduled  for consideration of prophylactic appendectomy. Gerrit Heck, MD 06/16/2020 2:13:34 PM

## 2020-06-16 NOTE — Progress Notes (Signed)
To pacu, VSS. Report to [email or web address] °

## 2020-06-16 NOTE — Progress Notes (Signed)
Called to room to assist during endoscopic procedure.  Patient ID and intended procedure confirmed with present staff. Received instructions for my participation in the procedure from the performing physician.  

## 2020-06-16 NOTE — Patient Instructions (Addendum)
Information on polyps and diverticulosis given to you today.  Await pathology results.  Resume previous diet and medications.  Refer to s surgeon at appointment to be scheduled for consideration of prophylactic appendectomy.  YOU HAD AN ENDOSCOPIC PROCEDURE TODAY AT [facility]:   Refer to the procedure report that was given to you for any specific questions about what was found during the examination.  If the procedure report does not answer your questions, please call your gastroenterologist to clarify.  If you requested that your care partner not be given the details of your procedure findings, then the procedure report has been included in a sealed envelope for you to review at your convenience later.  YOU SHOULD EXPECT: Some feelings of bloating in the abdomen. Passage of more gas than usual.  Walking can help get rid of the air that was put into your GI tract during the procedure and reduce the bloating. If you had a lower endoscopy (such as a colonoscopy or flexible sigmoidoscopy) you may notice spotting of blood in your stool or on the toilet paper. If you underwent a bowel prep for your procedure, you may not have a normal bowel movement for a few days.  Please Note:  You might notice some irritation and congestion in your nose or some drainage.  This is from the oxygen used during your procedure.  There is no need for concern and it should clear up in a day or so.  SYMPTOMS TO REPORT IMMEDIATELY:  Following lower endoscopy (colonoscopy or flexible sigmoidoscopy):  Excessive amounts of blood in the stool  Significant tenderness or worsening of abdominal pains  Swelling of the abdomen that is new, acute  Fever of 100F or higher  For urgent or emergent issues, a gastroenterologist can be reached at any hour by calling [phone]. Do not use MyChart messaging for urgent concerns.    DIET:  We do recommend a small meal at first, but then you may proceed to your  regular diet.  Drink plenty of fluids but you should avoid alcoholic beverages for 24 hours.  ACTIVITY:  You should plan to take it easy for the rest of today and you should NOT DRIVE or use heavy machinery until tomorrow (because of the sedation medicines used during the test).    FOLLOW UP: Our staff will call the number listed on your records 48-72 hours following your procedure to check on you and address any questions or concerns that you may have regarding the information given to you following your procedure. If we do not reach you, we will leave a message.  We will attempt to reach you two times.  During this call, we will ask if you have developed any symptoms of COVID 19. If you develop any symptoms (ie: fever, flu-like symptoms, shortness of breath, cough etc.) before then, please call [phone].  If you test positive for Covid 19 in the 2 weeks post procedure, please call and report this information to [place].    If any biopsies were taken you will be contacted by phone or by letter within the next 1-3 weeks.  Please call us at [phone] if you have not heard about the biopsies in 3 weeks.    SIGNATURES/CONFIDENTIALITY: You and/or your care partner have signed paperwork which will be entered into your electronic medical record.  These signatures attest to the fact that that the information above on your After Visit Summary has been reviewed and is understood.  Full responsibility of the confidentiality of this discharge information lies with you and/or your care-partner.

## 2020-06-16 NOTE — Progress Notes (Signed)
VS taken by C.W. 

## 2020-06-20 ENCOUNTER — Telehealth: Payer: Self-pay

## 2020-06-20 ENCOUNTER — Telehealth: Payer: Self-pay | Admitting: *Deleted

## 2020-06-20 NOTE — Telephone Encounter (Signed)
First post procedure follow up call, no answer 

## 2020-06-20 NOTE — Telephone Encounter (Signed)
Second follow up call made.

## 2020-06-22 ENCOUNTER — Encounter: Payer: Self-pay | Admitting: Gastroenterology

## 2020-06-23 ENCOUNTER — Telehealth: Payer: Self-pay

## 2020-06-23 NOTE — Telephone Encounter (Signed)
Per 06/16/20 procedure note - Refer to a surgeon at a appointment to be scheduled for consideration of prophylactic appendectomy.   Referral, records, demographic, and insurance information faxed to [facility].

## 2020-06-26 ENCOUNTER — Telehealth: Payer: Self-pay | Admitting: General Surgery

## 2020-06-26 NOTE — Telephone Encounter (Signed)
Contacted the patient and went over her pathology and the suggestion of repeating her colonoscopy in 6-12 months. The patient verbalized understanding an was on board with this decision., Recall place for 6 months

## 2020-06-26 NOTE — Telephone Encounter (Signed)
-----   Message from Lavena Bullion, DO sent at 06/22/2020  2:16 PM EDT -----  ----- Message ----- From: Interface, Lab In [facility] Sent: 06/22/2020   9:45 AM EDT To: Lavena Bullion, DO

## 2020-09-27 ENCOUNTER — Emergency Department: Admit: 2020-09-27 | Discharge status: Absent without leave | Payer: Self-pay

## 2020-09-27 ENCOUNTER — Other Ambulatory Visit: Payer: Self-pay

## 2020-09-27 ENCOUNTER — Emergency Department
Admission: EM | Admit: 2020-09-27 | Discharge: 2020-09-27 | Disposition: A | Discharge status: Home / Self Care | Payer: BC Managed Care – PPO | Source: Home / Self Care | Attending: Family Medicine | Admitting: Family Medicine

## 2020-09-27 DIAGNOSIS — T63441A Toxic effect of venom of bees, accidental (unintentional), initial encounter: Secondary | ICD-10-CM | POA: Diagnosis not present

## 2020-09-27 MED ORDER — METHYLPREDNISOLONE 4 MG PO TBPK
ORAL_TABLET | ORAL | 0 refills | Status: DC
Start: 2020-09-27 — End: 2021-03-20

## 2020-09-27 NOTE — ED Triage Notes (Signed)
Pt presents to Urgent Care with c/o L calf swelling and pain d/t yellow jacket sting to L lower leg last night. Reports severe generalized itching immediately after the sting; has taken benadryl and aleve. Denies oral swelling/difficulty breathing.

## 2020-09-27 NOTE — ED Provider Notes (Signed)
[name]    CSN: [id number] Arrival date & time: 09/27/20  1116      History   Chief Complaint Chief Complaint  Patient presents with   Insect Bite    HPI Shirley Taylor is a 50 y.o. female.   HPI  Patient has a known local allergic reaction to wasp stings.  She was stung yesterday by yellow jacket on the back of her left leg.  Today her leg has been progressively more swollen and uncomfortable.  Immediately after the sting she had hives all over.  She took a Benadryl and these went away.  She has never had any difficulty breathing, swelling of mouth or throat, change in voice, or anaphylactic reaction  Past Medical History:  Diagnosis Date   Anxiety    Arthritis    Bilateral ovarian cysts    Hyperlipidemia    Renal calculus    Renal disorder     Patient Active Problem List   Diagnosis Date Noted   Class 1 obesity due to excess calories without serious comorbidity with body mass index (BMI) of 33.0 to 33.9 in adult 04/10/2020   Seborrheic keratoses 04/10/2020   Ganglion cyst of volar aspect of right wrist 02/23/2018   Open displaced fracture of phalanx of toe of right foot 06/03/2017   Right Pelvicaliectasis 04/08/2017   Abdominal pain, acute, right lower quadrant 04/07/2017   Drug reaction 04/10/2015   Hyperlipidemia 05/18/2014   Depression 03/14/2014   Generalized anxiety disorder 03/14/2014    Past Surgical History:  Procedure Laterality Date   ABDOMINAL HYSTERECTOMY     ANKLE SURGERY     LEG SURGERY     LT    OB History   No obstetric history on file.      Home Medications    Prior to Admission medications   Medication Sig Start Date End Date Taking? Authorizing Provider  methylPREDNISolone (MEDROL DOSEPAK) 4 MG TBPK tablet tad 09/27/20  Yes Raylene Everts, MD  diphenhydrAMINE (BENADRYL) 25 MG tablet Take 25 mg by mouth every 6 (six) hours as needed.    [provider]  melatonin 5 MG TABS Take 5 mg by mouth.     [provider]    Family History Family History  Problem Relation Age of Onset   Diabetes Mother    Hypertension Mother    COPD Mother    Cancer Mother        lung   Depression Mother    Hyperlipidemia Mother    Colon polyps Mother    Hypertension Brother    Alcohol abuse Father    Colon cancer Neg Hx    Esophageal cancer Neg Hx    Rectal cancer Neg Hx    Stomach cancer Neg Hx     Social History Social History   Tobacco Use   Smoking status: Never   Smokeless tobacco: Never  Vaping Use   Vaping Use: Never used  Substance Use Topics   Alcohol use: Yes    Alcohol/week: 2.0 standard drinks    Types: 2 Cans of beer per week    Comment: weekly   Drug use: No     Allergies   Clindamycin/lincomycin   Review of Systems Review of Systems See HPI  Physical Exam Triage Vital Signs ED Triage Vitals  Enc Vitals Group     BP 09/27/20 1139 133/81     Pulse Rate 09/27/20 1139 86     Resp 09/27/20 1139 20  Temp 09/27/20 1139 98.5 F (36.9 C)     Temp Source 09/27/20 1139 Oral     SpO2 09/27/20 1139 100 %     Weight 09/27/20 1137 185 lb (83.9 kg)     Height 09/27/20 1137 5\' 2"  (1.575 m)     Head Circumference --      Peak Flow --      Pain Score 09/27/20 1136 3     Pain Loc --      Pain Edu? --      Excl. in [place]? --    No data found.  Updated Vital Signs BP 133/81 (BP Location: Right Arm)   Pulse 86   Temp 98.5 F (36.9 C) (Oral)   Resp 20   Ht 5\' 2"  (1.575 m)   Wt 83.9 kg   SpO2 100%   BMI 33.84 kg/m     Physical Exam Constitutional:      General: She is not in acute distress.    Appearance: She is well-developed.  HENT:     Head: Normocephalic and atraumatic.  Eyes:     Conjunctiva/sclera: Conjunctivae normal.     Pupils: Pupils are equal, round, and reactive to light.  Cardiovascular:     Rate and Rhythm: Normal rate.  Pulmonary:     Effort: Pulmonary effort is normal. No respiratory distress.  Abdominal:     General: There  is no distension.     Palpations: Abdomen is soft.  Musculoskeletal:        General: Normal range of motion.     Cervical back: Normal range of motion.  Skin:    General: Skin is warm and dry.     Findings: Erythema present.     Comments: The left lower leg is swollen from approximately the tibial tuberosity down to the foot.  There is a puffy pink appearance to the leg.  The bite is visible on the back of the lower calf region.  No tenderness.  Neurological:     Mental Status: She is alert.     UC Treatments / Results  Labs (all labs ordered are listed, but only abnormal results are displayed) Labs Reviewed - No data to display  EKG   Radiology No results found.  Procedures Procedures (including critical care time)  Medications Ordered in UC Medications - No data to display  Initial Impression / Assessment and Plan / UC Course  I have reviewed the triage vital signs and the nursing notes.  Pertinent labs & imaging results that were available during my care of the patient were reviewed by me and considered in my medical decision making (see chart for details).     Reviewed local allergic reaction.  Recommend antihistamines and prednisone.  Do not see reason for an EpiPen at this point, but she should discuss this with her primary care doctor.  She states that her reactions are getting larger with time. Final Clinical Impressions(s) / UC Diagnoses   Final diagnoses:  Allergic reaction to bee sting     Discharge Instructions      Continue taking antihistamines.  May take a daily antihistamine like Claritin or Zyrtec, then the night antihistamine Benadryl Elevate leg Take the Medrol Dosepak as directed.  This is the steroid.  Take all of day 1 today, then tomorrow take as directed Discussed with your primary care doctor additional allergy advice   ED Prescriptions     Medication Sig Dispense Auth. Provider   methylPREDNISolone (MEDROL DOSEPAK) 4  MG TBPK tablet  tad 21 tablet Raylene Everts, MD      PDMP not reviewed this encounter.   Raylene Everts, MD 09/27/20 1536

## 2020-09-27 NOTE — Discharge Instructions (Signed)
Continue taking antihistamines.  May take a daily antihistamine like Claritin or Zyrtec, then the night antihistamine Benadryl Elevate leg Take the Medrol Dosepak as directed.  This is the steroid.  Take all of day 1 today, then tomorrow take as directed Discussed with your primary care doctor additional allergy advice

## 2020-10-03 ENCOUNTER — Ambulatory Visit: Payer: BC Managed Care – PPO | Admitting: Physician Assistant

## 2020-11-07 DIAGNOSIS — L509 Urticaria, unspecified: Secondary | ICD-10-CM | POA: Diagnosis not present

## 2020-11-07 DIAGNOSIS — J302 Other seasonal allergic rhinitis: Secondary | ICD-10-CM | POA: Diagnosis not present

## 2020-11-07 DIAGNOSIS — T63451A Toxic effect of venom of hornets, accidental (unintentional), initial encounter: Secondary | ICD-10-CM | POA: Diagnosis not present

## 2020-11-09 DIAGNOSIS — J302 Other seasonal allergic rhinitis: Secondary | ICD-10-CM | POA: Diagnosis not present

## 2020-11-09 DIAGNOSIS — T63451A Toxic effect of venom of hornets, accidental (unintentional), initial encounter: Secondary | ICD-10-CM | POA: Diagnosis not present

## 2020-11-09 DIAGNOSIS — L509 Urticaria, unspecified: Secondary | ICD-10-CM | POA: Diagnosis not present

## 2020-11-21 ENCOUNTER — Emergency Department (INDEPENDENT_AMBULATORY_CARE_PROVIDER_SITE_OTHER): Payer: BC Managed Care – PPO

## 2020-11-21 ENCOUNTER — Emergency Department
Admission: EM | Admit: 2020-11-21 | Discharge: 2020-11-21 | Disposition: A | Discharge status: Home / Self Care | Payer: BC Managed Care – PPO | Source: Home / Self Care

## 2020-11-21 DIAGNOSIS — Y92009 Unspecified place in unspecified non-institutional (private) residence as the place of occurrence of the external cause: Secondary | ICD-10-CM | POA: Diagnosis not present

## 2020-11-21 DIAGNOSIS — M25561 Pain in right knee: Secondary | ICD-10-CM

## 2020-11-21 DIAGNOSIS — S86911A Strain of unspecified muscle(s) and tendon(s) at lower leg level, right leg, initial encounter: Secondary | ICD-10-CM | POA: Diagnosis not present

## 2020-11-21 DIAGNOSIS — W19XXXA Unspecified fall, initial encounter: Secondary | ICD-10-CM | POA: Diagnosis not present

## 2020-11-21 DIAGNOSIS — S99911A Unspecified injury of right ankle, initial encounter: Secondary | ICD-10-CM | POA: Diagnosis not present

## 2020-11-21 DIAGNOSIS — M79671 Pain in right foot: Secondary | ICD-10-CM | POA: Diagnosis not present

## 2020-11-21 DIAGNOSIS — S96911A Strain of unspecified muscle and tendon at ankle and foot level, right foot, initial encounter: Secondary | ICD-10-CM

## 2020-11-21 DIAGNOSIS — M25571 Pain in right ankle and joints of right foot: Secondary | ICD-10-CM

## 2020-11-21 NOTE — ED Triage Notes (Signed)
Pt presents with rt foot, ankle, and knee pain after a fall this morning

## 2020-11-21 NOTE — Discharge Instructions (Addendum)
Advised patient to RICE affected areas (right knee, right ankle, right foot) 20-25 minutes 2-3 times daily for the next 3 days.  Advised patient if symptoms worsen and or unresolved please follow-up with [facility] orthopedic provider (contact information provided above).

## 2020-11-21 NOTE — ED Provider Notes (Signed)
[facility]    CSN: [id number] Arrival date & time: 11/21/20  1443      History   Chief Complaint Chief Complaint  Patient presents with   Fall    Rt foot/ankle/knee    HPI Shirley Taylor is a 50 y.o. female.   HPI 50 year old female presents with right foot, right ankle, and right knee pain after falling this morning at home when attempting to get into her shower.  Patient is accompanied by her husband at this office visit.  Past Medical History:  Diagnosis Date   Anxiety    Arthritis    Bilateral ovarian cysts    Hyperlipidemia    Renal calculus    Renal disorder     Patient Active Problem List   Diagnosis Date Noted   Class 1 obesity due to excess calories without serious comorbidity with body mass index (BMI) of 33.0 to 33.9 in adult 04/10/2020   Seborrheic keratoses 04/10/2020   Ganglion cyst of volar aspect of right wrist 02/23/2018   Open displaced fracture of phalanx of toe of right foot 06/03/2017   Right Pelvicaliectasis 04/08/2017   Abdominal pain, acute, right lower quadrant 04/07/2017   Drug reaction 04/10/2015   Hyperlipidemia 05/18/2014   Depression 03/14/2014   Generalized anxiety disorder 03/14/2014    Past Surgical History:  Procedure Laterality Date   ABDOMINAL HYSTERECTOMY     ANKLE SURGERY     LEG SURGERY     LT    OB History   No obstetric history on file.      Home Medications    Prior to Admission medications   Medication Sig Start Date End Date Taking? Authorizing Provider  diphenhydrAMINE (BENADRYL) 25 MG tablet Take 25 mg by mouth every 6 (six) hours as needed. Patient not taking: Reported on 11/21/2020    [provider]  melatonin 5 MG TABS Take 5 mg by mouth. Patient not taking: Reported on 11/21/2020    [provider]  methylPREDNISolone (MEDROL DOSEPAK) 4 MG TBPK tablet tad Patient not taking: Reported on 11/21/2020 09/27/20   Raylene Everts, MD    Family History Family History   Problem Relation Age of Onset   Diabetes Mother    Hypertension Mother    COPD Mother    Cancer Mother        lung   Depression Mother    Hyperlipidemia Mother    Colon polyps Mother    Hypertension Brother    Alcohol abuse Father    Colon cancer Neg Hx    Esophageal cancer Neg Hx    Rectal cancer Neg Hx    Stomach cancer Neg Hx     Social History Social History   Tobacco Use   Smoking status: Never   Smokeless tobacco: Never  Vaping Use   Vaping Use: Never used  Substance Use Topics   Alcohol use: Yes    Alcohol/week: 2.0 standard drinks    Types: 2 Cans of beer per week    Comment: weekly   Drug use: No     Allergies   Clindamycin/lincomycin   Review of Systems Review of Systems  Musculoskeletal:        Right knee, right ankle, right foot pain secondary to fall in shower at home this morning    Physical Exam Triage Vital Signs ED Triage Vitals  Enc Vitals Group     BP 11/21/20 1453 (!) 155/95     Pulse Rate 11/21/20 1453 76  Resp 11/21/20 1453 12     Temp 11/21/20 1453 98.2 F (36.8 C)     Temp Source 11/21/20 1453 Oral     SpO2 11/21/20 1453 100 %     Weight --      Height --      Head Circumference --      Peak Flow --      Pain Score 11/21/20 1454 6     Pain Loc --      Pain Edu? --      Excl. in [place]? --    No data found.  Updated Vital Signs BP (!) 155/95 (BP Location: Right Arm)   Pulse 76   Temp 98.2 F (36.8 C) (Oral)   Resp 12   SpO2 100%   Physical Exam Vitals and nursing note reviewed.  Constitutional:      General: She is not in acute distress.    Appearance: Normal appearance. She is obese. She is not ill-appearing.  HENT:     Head: Normocephalic and atraumatic.     Mouth/Throat:     Mouth: Mucous membranes are moist.     Pharynx: Oropharynx is clear.  Eyes:     Extraocular Movements: Extraocular movements intact.     Conjunctiva/sclera: Conjunctivae normal.     Pupils: Pupils are equal, round, and reactive to  light.  Cardiovascular:     Rate and Rhythm: Normal rate and regular rhythm.     Pulses: Normal pulses.     Heart sounds: Normal heart sounds.  Pulmonary:     Effort: Pulmonary effort is normal.     Breath sounds: Normal breath sounds.  Musculoskeletal:        General: Normal range of motion.     Cervical back: Normal range of motion and neck supple.     Comments: Right knee: FROM, no pain elicited with flexion/extension, mild soft tissue swelling noted  Right ankle: FROM, mild pain elicited with ankle circles, dorsiflexion/plantar flexion with mild soft tissue swelling noted  Skin:    General: Skin is warm and dry.  Neurological:     General: No focal deficit present.     Mental Status: She is alert and oriented to person, place, and time. Mental status is at baseline.     UC Treatments / Results  Labs (all labs ordered are listed, but only abnormal results are displayed) Labs Reviewed - No data to display  EKG   Radiology DG Ankle Complete Right  Result Date: 11/21/2020 CLINICAL DATA:  Injury EXAM: RIGHT FOOT COMPLETE - 3+ VIEW; RIGHT ANKLE - COMPLETE 3+ VIEW COMPARISON:  None. FINDINGS: There is no evidence of fracture or dislocation. There is no evidence of arthropathy or other focal bone abnormality. Soft tissues are unremarkable. IMPRESSION: No acute osseous abnormality of the right foot and ankle. Electronically Signed   By: Yetta Glassman M.D.   On: 11/21/2020 15:24   DG Knee Complete 4 Views Right  Result Date: 11/21/2020 CLINICAL DATA:  Fall, pain in right lateral knee.  Left EXAM: RIGHT KNEE - COMPLETE 4+ VIEW COMPARISON:  None. FINDINGS: No evidence of fracture, dislocation, or joint effusion. No evidence of arthropathy or other focal bone abnormality. Soft tissues are unremarkable. IMPRESSION: Negative. Electronically Signed   By: Keane Police D.O.   On: 11/21/2020 15:23   DG Foot Complete Right  Result Date: 11/21/2020 CLINICAL DATA:  Injury EXAM: RIGHT  FOOT COMPLETE - 3+ VIEW; RIGHT ANKLE - COMPLETE 3+ VIEW COMPARISON:  None. FINDINGS: There is no evidence of fracture or dislocation. There is no evidence of arthropathy or other focal bone abnormality. Soft tissues are unremarkable. IMPRESSION: No acute osseous abnormality of the right foot and ankle. Electronically Signed   By: Yetta Glassman M.D.   On: 11/21/2020 15:24    Procedures Procedures (including critical care time)  Medications Ordered in UC Medications - No data to display  Initial Impression / Assessment and Plan / UC Course  I have reviewed the triage vital signs and the nursing notes.  Pertinent labs & imaging results that were available during my care of the patient were reviewed by me and considered in my medical decision making (see chart for details).     MDM: 1. Fall; 2.  Right knee strain, initial encounter/3.  Strain of right ankle, initial encounter-Advised patient to RICE affected areas (right knee, right ankle, right foot) 20-25 minutes 2-3 times daily for the next 3 days.  Advised patient if symptoms worsen and or unresolved please follow-up with [facility] orthopedic provider (contact information provided above).  Patient discharged home, hemodynamically stable. Final Clinical Impressions(s) / UC Diagnoses   Final diagnoses:  Fall in home, initial encounter  Knee strain, right, initial encounter  Strain of right ankle, initial encounter     Discharge Instructions      Advised patient to RICE affected areas (right knee, right ankle, right foot) 20-25 minutes 2-3 times daily for the next 3 days.  Advised patient if symptoms worsen and or unresolved please follow-up with [facility] orthopedic provider (contact information provided above).     ED Prescriptions   None    PDMP not reviewed this encounter.   Eliezer Lofts, [place] 11/29/20 [phone]

## 2020-12-16 DIAGNOSIS — T63441D Toxic effect of venom of bees, accidental (unintentional), subsequent encounter: Secondary | ICD-10-CM | POA: Diagnosis not present

## 2020-12-16 DIAGNOSIS — T63451D Toxic effect of venom of hornets, accidental (unintentional), subsequent encounter: Secondary | ICD-10-CM | POA: Diagnosis not present

## 2020-12-16 DIAGNOSIS — T63461D Toxic effect of venom of wasps, accidental (unintentional), subsequent encounter: Secondary | ICD-10-CM | POA: Diagnosis not present

## 2020-12-28 DIAGNOSIS — T63451D Toxic effect of venom of hornets, accidental (unintentional), subsequent encounter: Secondary | ICD-10-CM | POA: Diagnosis not present

## 2020-12-28 DIAGNOSIS — T63441D Toxic effect of venom of bees, accidental (unintentional), subsequent encounter: Secondary | ICD-10-CM | POA: Diagnosis not present

## 2020-12-28 DIAGNOSIS — T63461D Toxic effect of venom of wasps, accidental (unintentional), subsequent encounter: Secondary | ICD-10-CM | POA: Diagnosis not present

## 2021-01-04 DIAGNOSIS — T63461A Toxic effect of venom of wasps, accidental (unintentional), initial encounter: Secondary | ICD-10-CM | POA: Diagnosis not present

## 2021-01-07 DIAGNOSIS — Z7189 Other specified counseling: Secondary | ICD-10-CM | POA: Diagnosis not present

## 2021-01-11 DIAGNOSIS — T63461A Toxic effect of venom of wasps, accidental (unintentional), initial encounter: Secondary | ICD-10-CM | POA: Diagnosis not present

## 2021-01-18 DIAGNOSIS — T63461A Toxic effect of venom of wasps, accidental (unintentional), initial encounter: Secondary | ICD-10-CM | POA: Diagnosis not present

## 2021-01-25 DIAGNOSIS — T63461A Toxic effect of venom of wasps, accidental (unintentional), initial encounter: Secondary | ICD-10-CM | POA: Diagnosis not present

## 2021-02-01 DIAGNOSIS — T63461A Toxic effect of venom of wasps, accidental (unintentional), initial encounter: Secondary | ICD-10-CM | POA: Diagnosis not present

## 2021-02-07 DIAGNOSIS — Z7189 Other specified counseling: Secondary | ICD-10-CM | POA: Diagnosis not present

## 2021-02-08 DIAGNOSIS — T63461A Toxic effect of venom of wasps, accidental (unintentional), initial encounter: Secondary | ICD-10-CM | POA: Diagnosis not present

## 2021-02-15 DIAGNOSIS — T63461A Toxic effect of venom of wasps, accidental (unintentional), initial encounter: Secondary | ICD-10-CM | POA: Diagnosis not present

## 2021-03-01 ENCOUNTER — Encounter: Payer: Self-pay | Admitting: Gastroenterology

## 2021-03-01 NOTE — Telephone Encounter (Signed)
Dr. Bryan Lemma, will you please clarify recall for this patient's colonoscopy. I do not see a recall in epic. Per 06/16/2020 path results, recall was recommended in 6-12 months. Letter that was mailed to patient on 06/22/20 says to repeat in 5 years? Please advise, thanks

## 2021-03-01 NOTE — Telephone Encounter (Signed)
That certainly is confusing.  I do remember her case and remember discussing it with one of the surgeons.  True recommendation should be for repeat colonoscopy at 6-12 months because she had a SSP that was removed from the appendiceal orifice.  If repeat colonoscopy is unremarkable, next colonoscopy would be in 5 years.  Please apologize to the patient for this confusion.

## 2021-03-07 DIAGNOSIS — Z7189 Other specified counseling: Secondary | ICD-10-CM | POA: Diagnosis not present

## 2021-03-16 ENCOUNTER — Telehealth: Payer: Self-pay

## 2021-03-18 ENCOUNTER — Other Ambulatory Visit (HOSPITAL_BASED_OUTPATIENT_CLINIC_OR_DEPARTMENT_OTHER): Payer: Self-pay | Admitting: Physician Assistant

## 2021-03-18 DIAGNOSIS — Z1231 Encounter for screening mammogram for malignant neoplasm of breast: Secondary | ICD-10-CM

## 2021-03-19 NOTE — Telephone Encounter (Signed)
Patient has been scheduled fore recall colonoscopy. ?

## 2021-03-20 ENCOUNTER — Ambulatory Visit (AMBULATORY_SURGERY_CENTER): Payer: BC Managed Care – PPO | Admitting: *Deleted

## 2021-03-20 ENCOUNTER — Other Ambulatory Visit: Payer: Self-pay

## 2021-03-20 VITALS — Ht 62.0 in | Wt 182.0 lb

## 2021-03-20 DIAGNOSIS — Z86010 Personal history of colonic polyps: Secondary | ICD-10-CM

## 2021-03-20 MED ORDER — CLENPIQ 10-3.5-12 MG-GM -GM/160ML PO SOLN: 1.0000 | ORAL | 0 refills | Status: DC

## 2021-03-20 NOTE — Progress Notes (Signed)
No egg or soy allergy known to patient  ?No issues known to pt with past sedation with any surgeries or procedures ?Patient denies ever being told they had issues or difficulty with intubation  ?No FH of Malignant Hyperthermia ?Pt is not on diet pills ?Pt is not on  home 02  ?Pt is not on blood thinners  ?Pt denies issues with constipation  ?No A fib or A flutter ? ?Pt is not vaccinated  for Covid  ? ?Clenpiq Coupon to pt in PV today , Code to Pharmacy and  NO PA's for preps discussed with pt In PV today  ?Discussed with pt there will be an out-of-pocket cost for prep and that varies from $0 to 70 +  dollars - pt verbalized understanding  ? ?Due to the COVID-19 pandemic we are asking patients to follow certain guidelines in PV and the [place]   ?Pt aware of COVID protocols and [facility] guidelines  ? ?PV completed over the phone. Pt verified name, DOB, address and insurance during PV today.  ?Pt mailed instruction packet with copy of consent form to read and not return, and instructions.  ?Pt encouraged to call with questions or issues.  ?If pt has My chart, procedure instructions sent via My Chart  ? ?

## 2021-03-29 DIAGNOSIS — Z1231 Encounter for screening mammogram for malignant neoplasm of breast: Secondary | ICD-10-CM

## 2021-04-05 ENCOUNTER — Encounter: Payer: Self-pay | Admitting: Gastroenterology

## 2021-04-07 DIAGNOSIS — Z7189 Other specified counseling: Secondary | ICD-10-CM | POA: Diagnosis not present

## 2021-04-09 ENCOUNTER — Encounter: Payer: Self-pay | Admitting: Gastroenterology

## 2021-04-09 ENCOUNTER — Ambulatory Visit (AMBULATORY_SURGERY_CENTER): Payer: BC Managed Care – PPO | Admitting: Gastroenterology

## 2021-04-09 VITALS — BP 144/75 | HR 66 | Temp 97.5°F | Resp 13 | Ht 62.0 in | Wt 181.0 lb

## 2021-04-09 DIAGNOSIS — D120 Benign neoplasm of cecum: Secondary | ICD-10-CM | POA: Diagnosis not present

## 2021-04-09 DIAGNOSIS — Z86010 Personal history of colonic polyps: Secondary | ICD-10-CM | POA: Diagnosis not present

## 2021-04-09 DIAGNOSIS — D122 Benign neoplasm of ascending colon: Secondary | ICD-10-CM | POA: Diagnosis not present

## 2021-04-09 DIAGNOSIS — K5730 Diverticulosis of large intestine without perforation or abscess without bleeding: Secondary | ICD-10-CM

## 2021-04-09 DIAGNOSIS — K388 Other specified diseases of appendix: Secondary | ICD-10-CM | POA: Diagnosis not present

## 2021-04-09 DIAGNOSIS — K635 Polyp of colon: Secondary | ICD-10-CM

## 2021-04-09 MED ORDER — SODIUM CHLORIDE 0.9 % IV SOLN: 500.0000 mL | Freq: Once | INTRAVENOUS | Status: DC

## 2021-04-09 NOTE — Op Note (Signed)
[name] ?Patient Name: Shirley Taylor ?Procedure Date: 04/09/2021 1:22 PM ?MRN: [id number] ?Endoscopist: Gerrit Heck , MD ?Age: 51 ?Referring MD:  ?Date of Birth: 1970/12/09 ?Gender: Female ?Account #: [id number] ?Procedure:                Colonoscopy ?Indications:              High risk colon cancer surveillance: Personal  ?                          history of colonic polyps ?                          Index colonoscopy for CRC screening in 06/2020  ?                          notable for 8 mm sessile serrated polyp removed  ?                          from the appendiceal orifice, along with 5  ?                          additional subcentimeter polyps removed throughout  ?                          the colon (path: TA x1, otherwise HPs and benign  ?                          lymphoid aggregate), Sigmoid diverticulosis, normal  ?                          TI. She returns today for short interval repeat  ?                          colonoscopy due to location of SSP. ?Medicines:                Monitored Anesthesia Care ?Procedure:                Pre-Anesthesia Assessment: ?                          - Prior to the procedure, a History and Physical  ?                          was performed, and patient medications and  ?                          allergies were reviewed. The patient's tolerance of  ?                          previous anesthesia was also reviewed. The risks  ?                          and benefits of the procedure and the sedation  ?  options and risks were discussed with the patient.  ?                          All questions were answered, and informed consent  ?                          was obtained. Prior Anticoagulants: The patient has  ?                          taken no previous anticoagulant or antiplatelet  ?                          agents. ASA Grade Assessment: II - A patient with  ?                          mild systemic disease. After reviewing the risks  ?                           and benefits, the patient was deemed in  ?                          satisfactory condition to undergo the procedure. ?                          After obtaining informed consent, the colonoscope  ?                          was passed under direct vision. Throughout the  ?                          procedure, the patient's blood pressure, pulse, and  ?                          oxygen saturations were monitored continuously. The  ?                          Olympus CF-[id number] (#[id number]) Colonoscope was  ?                          introduced through the anus and advanced to the the  ?                          cecum, identified by appendiceal orifice and  ?                          ileocecal valve. The colonoscopy was performed  ?                          without difficulty. The patient tolerated the  ?                          procedure well. The quality of the bowel  ?  preparation was excellent. The ileocecal valve,  ?                          appendiceal orifice, and rectum were photographed. ?Scope In: 1:26:36 PM ?Scope Out: 1:48:53 PM ?Scope Withdrawal Time: 0 hours 18 minutes 42 seconds  ?Total Procedure Duration: 0 hours 22 minutes 17 seconds  ?Findings:                 The perianal and digital rectal examinations were  ?                          normal. ?                          A small (2-3 mm) are of polypoid tissue was found  ?                          at the appendiceal orifice. This did not invade  ?                          into the appendiceal orifice. The polypoid tissue  ?                          was removed with a cold biopsy forceps. Resection  ?                          and retrieval were complete. Estimated blood loss  ?                          was minimal. ?                          A 5 mm polyp was found in the ascending colon. The  ?                          polyp was sessile. The polyp was removed with a  ?                          cold snare.  Resection and retrieval were complete.  ?                          Estimated blood loss was minimal. ?                          Multiple small and large-mouthed diverticula were  ?                          found in the sigmoid colon. ?                          The retroflexed view of the distal rectum and anal  ?                          verge was normal and showed no anal or rectal  ?  abnormalities. ?Complications:            No immediate complications. ?Estimated Blood Loss:     Estimated blood loss was minimal. ?Impression:               - A small (2-3 mm) are of polypoid tissue was found  ?                          at the appendiceal orifice. This did not invade  ?                          into the appendiceal orifice. The polypoid tissue  ?                          was removed with a cold biopsy forceps. Resected  ?                          and retrieved. ?                          - One 5 mm polyp in the ascending colon, removed  ?                          with a cold snare. Resected and retrieved. ?                          - Diverticulosis in the sigmoid colon. ?                          - The distal rectum and anal verge are normal on  ?                          retroflexion view. ?Recommendation:           - Patient has a contact number available for  ?                          emergencies. The signs and symptoms of potential  ?                          delayed complications were discussed with the  ?                          patient. Return to normal activities tomorrow.  ?                          Written discharge instructions were provided to the  ?                          patient. ?                          - Resume previous diet. ?                          - Continue present medications. ?                          -  Await pathology results. ?                          - Repeat colonoscopy for surveillance based on  ?                          pathology results. ?                           - Return to GI office PRN. ?Gerrit Heck, MD ?04/09/2021 1:56:05 PM ?

## 2021-04-09 NOTE — Patient Instructions (Signed)
Handouts given on diverticulosis and polyps. Await pathology results. ?Resume previous diet and continue present medications. ?Repeat colonoscopy will be determined based off of pathology results. ? ?YOU HAD AN ENDOSCOPIC PROCEDURE TODAY AT [facility]:   Refer to the procedure report that was given to you for any specific questions about what was found during the examination.  If the procedure report does not answer your questions, please call your gastroenterologist to clarify.  If you requested that your care partner not be given the details of your procedure findings, then the procedure report has been included in a sealed envelope for you to review at your convenience later. ? ?YOU SHOULD EXPECT: Some feelings of bloating in the abdomen. Passage of more gas than usual.  Walking can help get rid of the air that was put into your GI tract during the procedure and reduce the bloating. If you had a lower endoscopy (such as a colonoscopy or flexible sigmoidoscopy) you may notice spotting of blood in your stool or on the toilet paper. If you underwent a bowel prep for your procedure, you may not have a normal bowel movement for a few days. ? ?Please Note:  You might notice some irritation and congestion in your nose or some drainage.  This is from the oxygen used during your procedure.  There is no need for concern and it should clear up in a day or so. ? ?SYMPTOMS TO REPORT IMMEDIATELY: ? ?Following lower endoscopy (colonoscopy or flexible sigmoidoscopy): ? Excessive amounts of blood in the stool ? Significant tenderness or worsening of abdominal pains ? Swelling of the abdomen that is new, acute ? Fever of 100?F or higher ? ?For urgent or emergent issues, a gastroenterologist can be reached at any hour by calling [phone]. ?Do not use MyChart messaging for urgent concerns.  ? ? ?DIET:  We do recommend a small meal at first, but then you may proceed to your regular diet.  Drink plenty of  fluids but you should avoid alcoholic beverages for 24 hours. ? ?ACTIVITY:  You should plan to take it easy for the rest of today and you should NOT DRIVE or use heavy machinery until tomorrow (because of the sedation medicines used during the test).   ? ?FOLLOW UP: ?Our staff will call the number listed on your records 48-72 hours following your procedure to check on you and address any questions or concerns that you may have regarding the information given to you following your procedure. If we do not reach you, we will leave a message.  We will attempt to reach you two times.  During this call, we will ask if you have developed any symptoms of COVID 19. If you develop any symptoms (ie: fever, flu-like symptoms, shortness of breath, cough etc.) before then, please call [phone].  If you test positive for Covid 19 in the 2 weeks post procedure, please call and report this information to [place].   ? ?If any biopsies were taken you will be contacted by phone or by letter within the next 1-3 weeks.  Please call us at [phone] if you have not heard about the biopsies in 3 weeks.  ? ? ?SIGNATURES/CONFIDENTIALITY: ?You and/or your care partner have signed paperwork which will be entered into your electronic medical record.  These signatures attest to the fact that that the information above on your After Visit Summary has been reviewed and is understood.  Full responsibility of the confidentiality of this discharge  information lies with you and/or your care-partner.  ?

## 2021-04-09 NOTE — Progress Notes (Signed)
Nad vss trans to pacu 

## 2021-04-09 NOTE — Progress Notes (Signed)
? ?GASTROENTEROLOGY PROCEDURE H&P NOTE  ? ?Primary Care Physician: ?Donella Stade, PA-C ? ? ? ?Reason for Procedure:  Colon polyp surveillance ? ?Plan:    Colonoscopy ? ?Patient is appropriate for endoscopic procedure(s) in the ambulatory ([place]) setting. ? ?The nature of the procedure, as well as the risks, benefits, and alternatives were carefully and thoroughly reviewed with the patient. Ample time for discussion and questions allowed. The patient understood, was satisfied, and agreed to proceed.  ? ? ? ?HPI: ?Shirley Taylor is a 51 y.o. female who presents for colonoscopy for short interval polyp surveillance. ? ?Index colonoscopy for CRC screening in 06/2020 notable for 8 mm sessile serrated polyp removed from the appendiceal orifice, along with 5 additional subcentimeter polyps removed throughout the colon (path: TA x1, otherwise HPVs and benign lymphoid aggregate), Sigmoid diverticulosis normal TI.   ? ?I previously discussed her case with one of the surgeons and agreed on short interval repeat colonoscopy in 6-12 months to evaluate for full resection of the a of polyp rather than appendectomy or other surgical intervention.   ? ?She is otherwise without GI symptoms. ? ?Past Medical History:  ?Diagnosis Date  ? Allergy   ? seasonal  ? Anxiety   ? Arthritis   ? left ankle  ? Bilateral ovarian cysts   ? GERD (gastroesophageal reflux disease)   ? OCC  ? Hyperlipidemia   ? PONV (postoperative nausea and vomiting)   ? x 1 only  ? Renal calculus   ? Renal disorder   ? ? ?Past Surgical History:  ?Procedure Laterality Date  ? ABDOMINAL HYSTERECTOMY    ? ANKLE SURGERY Left   ? several surgeries on left ankle  ? COLONOSCOPY    ? LEG SURGERY    ? LT  ? POLYPECTOMY    ? WISDOM TOOTH EXTRACTION    ? ? ?Prior to Admission medications   ?Medication Sig Start Date End Date Taking? Authorizing Provider  ?diphenhydrAMINE (BENADRYL) 25 MG tablet Take 25 mg by mouth every 6 (six) hours as needed.   Yes [provider]   ?melatonin 5 MG TABS Take 5 mg by mouth.   Yes [provider]  ?EPINEPHrine 0.3 mg/0.3 mL IJ SOAJ injection PLEASE SEE ATTACHED FOR DETAILED DIRECTIONS 11/07/20   [provider]  ? ? ?Current Outpatient Medications  ?Medication Sig Dispense Refill  ? diphenhydrAMINE (BENADRYL) 25 MG tablet Take 25 mg by mouth every 6 (six) hours as needed.    ? melatonin 5 MG TABS Take 5 mg by mouth.    ? EPINEPHrine 0.3 mg/0.3 mL IJ SOAJ injection PLEASE SEE ATTACHED FOR DETAILED DIRECTIONS    ? ?Current Facility-Administered Medications  ?Medication Dose Route Frequency Provider Last Rate Last Admin  ? 0.9 %  sodium chloride infusion  500 mL Intravenous Once Zayaan Kozak V, DO      ? ? ?Allergies as of 04/09/2021 - Review Complete 04/09/2021  ?Allergen Reaction Noted  ? Clindamycin/lincomycin  04/10/2015  ? ? ?Family History  ?Problem Relation Age of Onset  ? Diabetes Mother   ? Hypertension Mother   ? COPD Mother   ? Cancer Mother   ?     lung  ? Depression Mother   ? Hyperlipidemia Mother   ? Colon polyps Mother   ? Hypertension Brother   ? Alcohol abuse Father   ? Colon cancer Neg Hx   ? Esophageal cancer Neg Hx   ? Rectal cancer Neg Hx   ?  Stomach cancer Neg Hx   ? ? ?Social History  ? ?Socioeconomic History  ? Marital status: Married  ?  Spouse name: Not on file  ? Number of children: Not on file  ? Years of education: Not on file  ? Highest education level: Not on file  ?Occupational History  ? Not on file  ?Tobacco Use  ? Smoking status: Never  ? Smokeless tobacco: Never  ?Vaping Use  ? Vaping Use: Never used  ?Substance and Sexual Activity  ? Alcohol use: Yes  ?  Alcohol/week: 2.0 standard drinks  ?  Types: 2 Cans of beer per week  ?  Comment: weekly  ? Drug use: No  ? Sexual activity: Yes  ?Other Topics Concern  ? Not on file  ?Social History Narrative  ? Not on file  ? ?Social Determinants of Health  ? ?Financial Resource Strain: Not on file  ?Food Insecurity: Not on file  ?Transportation Needs:  Not on file  ?Physical Activity: Not on file  ?Stress: Not on file  ?Social Connections: Not on file  ?Intimate Partner Violence: Not on file  ? ? ?Physical Exam: ?Vital signs in last 24 hours: ?'@BP'$  (!) 151/69   Pulse 80   Temp (!) 97.5 ?F (36.4 ?C)   Ht '5\' 2"'$  (1.575 m)   Wt 181 lb (82.1 kg)   SpO2 100%   BMI 33.11 kg/m?  ?GEN: NAD ?EYE: Sclerae anicteric ?ENT: MMM ?CV: Non-tachycardic ?Pulm: CTA b/l ?GI: Soft, NT/ND ?NEURO:  Alert & Oriented x 3 ? ? ?Gerrit Heck, DO ?[facility] ? ? ?04/09/2021 1:18 PM ? ?

## 2021-04-09 NOTE — Progress Notes (Signed)
Pt's states no medical or surgical changes since previsit or office visit. VS by DT. °

## 2021-04-11 ENCOUNTER — Telehealth: Payer: Self-pay

## 2021-04-11 NOTE — Telephone Encounter (Signed)
?  Follow up Call- ? ? ?  04/09/2021  ? 12:24 PM 06/16/2020  ? 12:54 PM  ?Call back number  ?Post procedure Call Back phone  # [phone]  ?Permission to leave phone message Yes Yes  ?  ? ?Patient questions: ? ?Do you have a fever, pain , or abdominal swelling? No. ?Pain Score  0 * ? ?Have you tolerated food without any problems? Yes.   ? ?Have you been able to return to your normal activities? Yes.   ? ?Do you have any questions about your discharge instructions: ?Diet   No. ?Medications  No. ?Follow up visit  No. ? ?Do you have questions or concerns about your Care? No. ? ?Actions: ?* If pain score is 4 or above: ?No action needed, pain <4. ? ? ?

## 2021-04-12 ENCOUNTER — Ambulatory Visit (INDEPENDENT_AMBULATORY_CARE_PROVIDER_SITE_OTHER): Payer: BC Managed Care – PPO

## 2021-04-12 DIAGNOSIS — Z1231 Encounter for screening mammogram for malignant neoplasm of breast: Secondary | ICD-10-CM | POA: Diagnosis not present

## 2021-04-15 NOTE — Progress Notes (Signed)
Normal mammogram. Follow up in 1 year.

## 2021-04-15 NOTE — Progress Notes (Signed)
Please disregard last message. Wrong normal button was hit. More imaging is needed for possible mass in the left breast. You will be called by imaging to schedule.

## 2021-04-16 ENCOUNTER — Other Ambulatory Visit: Payer: Self-pay | Admitting: Physician Assistant

## 2021-04-16 DIAGNOSIS — R928 Other abnormal and inconclusive findings on diagnostic imaging of breast: Secondary | ICD-10-CM

## 2021-04-24 ENCOUNTER — Ambulatory Visit
Admission: RE | Admit: 2021-04-24 | Discharge: 2021-04-24 | Disposition: A | Discharge status: Home / Self Care | Payer: BC Managed Care – PPO | Source: Ambulatory Visit | Attending: Physician Assistant | Admitting: Physician Assistant

## 2021-04-24 ENCOUNTER — Encounter: Payer: Self-pay | Admitting: Physician Assistant

## 2021-04-24 ENCOUNTER — Encounter: Payer: Self-pay | Admitting: Gastroenterology

## 2021-04-24 ENCOUNTER — Other Ambulatory Visit: Payer: Self-pay | Admitting: Physician Assistant

## 2021-04-24 DIAGNOSIS — R928 Other abnormal and inconclusive findings on diagnostic imaging of breast: Secondary | ICD-10-CM | POA: Diagnosis not present

## 2021-04-24 DIAGNOSIS — N6489 Other specified disorders of breast: Secondary | ICD-10-CM | POA: Diagnosis not present

## 2021-04-24 DIAGNOSIS — N6320 Unspecified lump in the left breast, unspecified quadrant: Secondary | ICD-10-CM

## 2021-04-24 DIAGNOSIS — N6012 Diffuse cystic mastopathy of left breast: Secondary | ICD-10-CM | POA: Insufficient documentation

## 2021-04-24 NOTE — Progress Notes (Signed)
Looks like benign left fibrocystic changes in the left breast. Diagnostic mammogram in 6 months.

## 2021-05-08 DIAGNOSIS — T63451A Toxic effect of venom of hornets, accidental (unintentional), initial encounter: Secondary | ICD-10-CM | POA: Diagnosis not present

## 2021-05-08 DIAGNOSIS — T63461A Toxic effect of venom of wasps, accidental (unintentional), initial encounter: Secondary | ICD-10-CM | POA: Diagnosis not present

## 2021-05-08 DIAGNOSIS — T63441A Toxic effect of venom of bees, accidental (unintentional), initial encounter: Secondary | ICD-10-CM | POA: Diagnosis not present

## 2021-05-08 DIAGNOSIS — L299 Pruritus, unspecified: Secondary | ICD-10-CM | POA: Diagnosis not present

## 2021-06-07 DIAGNOSIS — Z7189 Other specified counseling: Secondary | ICD-10-CM | POA: Diagnosis not present

## 2021-06-07 DIAGNOSIS — R7309 Other abnormal glucose: Secondary | ICD-10-CM | POA: Diagnosis not present

## 2021-06-12 ENCOUNTER — Telehealth: Payer: Self-pay

## 2021-06-12 MED ORDER — SCOPOLAMINE 1 MG/3DAYS TD PT72: 1.0000 | MEDICATED_PATCH | TRANSDERMAL | 0 refills | Status: AC

## 2021-06-12 NOTE — Telephone Encounter (Signed)
Motion sickness patches sent to pharmacy.

## 2021-07-07 DIAGNOSIS — R7309 Other abnormal glucose: Secondary | ICD-10-CM | POA: Diagnosis not present

## 2021-07-07 DIAGNOSIS — Z7189 Other specified counseling: Secondary | ICD-10-CM | POA: Diagnosis not present

## 2021-08-07 DIAGNOSIS — R7309 Other abnormal glucose: Secondary | ICD-10-CM | POA: Diagnosis not present

## 2021-09-07 DIAGNOSIS — R7309 Other abnormal glucose: Secondary | ICD-10-CM | POA: Diagnosis not present

## 2021-10-07 DIAGNOSIS — R7309 Other abnormal glucose: Secondary | ICD-10-CM | POA: Diagnosis not present

## 2021-10-29 ENCOUNTER — Other Ambulatory Visit: Payer: Self-pay | Admitting: Physician Assistant

## 2021-10-29 ENCOUNTER — Ambulatory Visit: Payer: BC Managed Care – PPO

## 2021-10-29 ENCOUNTER — Ambulatory Visit
Admission: RE | Admit: 2021-10-29 | Discharge: 2021-10-29 | Disposition: A | Discharge status: Home / Self Care | Payer: BC Managed Care – PPO | Source: Ambulatory Visit | Attending: Physician Assistant | Admitting: Physician Assistant

## 2021-10-29 DIAGNOSIS — N6320 Unspecified lump in the left breast, unspecified quadrant: Secondary | ICD-10-CM

## 2021-10-29 DIAGNOSIS — R928 Other abnormal and inconclusive findings on diagnostic imaging of breast: Secondary | ICD-10-CM

## 2021-10-29 DIAGNOSIS — R92322 Mammographic fibroglandular density, left breast: Secondary | ICD-10-CM | POA: Diagnosis not present

## 2021-10-30 NOTE — Progress Notes (Signed)
Likely benign left breast mass. Repeat diagnostic in April 2024.

## 2021-11-07 DIAGNOSIS — R7309 Other abnormal glucose: Secondary | ICD-10-CM | POA: Diagnosis not present

## 2021-12-07 DIAGNOSIS — R7309 Other abnormal glucose: Secondary | ICD-10-CM | POA: Diagnosis not present

## 2022-01-07 DIAGNOSIS — R7309 Other abnormal glucose: Secondary | ICD-10-CM | POA: Diagnosis not present

## 2022-02-07 DIAGNOSIS — R7309 Other abnormal glucose: Secondary | ICD-10-CM | POA: Diagnosis not present

## 2022-04-09 ENCOUNTER — Telehealth: Payer: Self-pay | Admitting: Physician Assistant

## 2022-04-09 NOTE — Telephone Encounter (Signed)
-----   Message from Donella Stade, [place] sent at 04/24/2021  1:03 PM EDT ----- Diagnostic mammogram to follow up on left breast fibrocystic changes

## 2022-04-19 ENCOUNTER — Other Ambulatory Visit: Payer: Self-pay | Admitting: Physician Assistant

## 2022-04-19 DIAGNOSIS — N6320 Unspecified lump in the left breast, unspecified quadrant: Secondary | ICD-10-CM

## 2022-04-19 DIAGNOSIS — R928 Other abnormal and inconclusive findings on diagnostic imaging of breast: Secondary | ICD-10-CM

## 2022-05-02 ENCOUNTER — Other Ambulatory Visit: Payer: BC Managed Care – PPO

## 2022-10-25 ENCOUNTER — Other Ambulatory Visit: Payer: Self-pay | Admitting: Physician Assistant

## 2022-10-25 DIAGNOSIS — N6320 Unspecified lump in the left breast, unspecified quadrant: Secondary | ICD-10-CM

## 2022-10-25 DIAGNOSIS — R928 Other abnormal and inconclusive findings on diagnostic imaging of breast: Secondary | ICD-10-CM

## 2022-12-27 IMAGING — MG MM DIGITAL SCREENING BILAT W/ TOMO AND CAD
6 of 10 series · 6 of 30 positions shown · non-contrast
Comparison: Previous exam(s).

CLINICAL DATA: Screening.

EXAM:
DIGITAL SCREENING BILATERAL MAMMOGRAM WITH TOMOSYNTHESIS AND CAD
TECHNIQUE: Bilateral screening digital craniocaudal and mediolateral oblique
mammograms were obtained. Bilateral screening digital breast
tomosynthesis was performed. The images were evaluated with
computer-aided detection.

[L CC synth-2D]
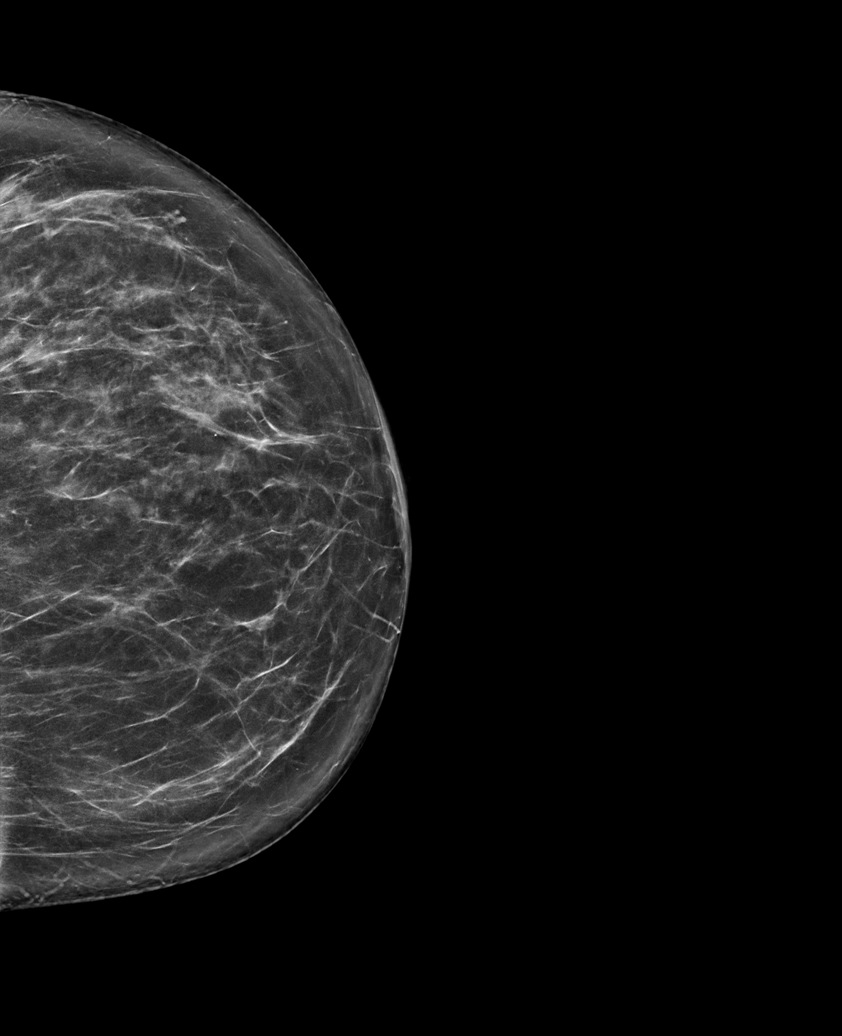

[R MLO synth-2D (1 of 2)]
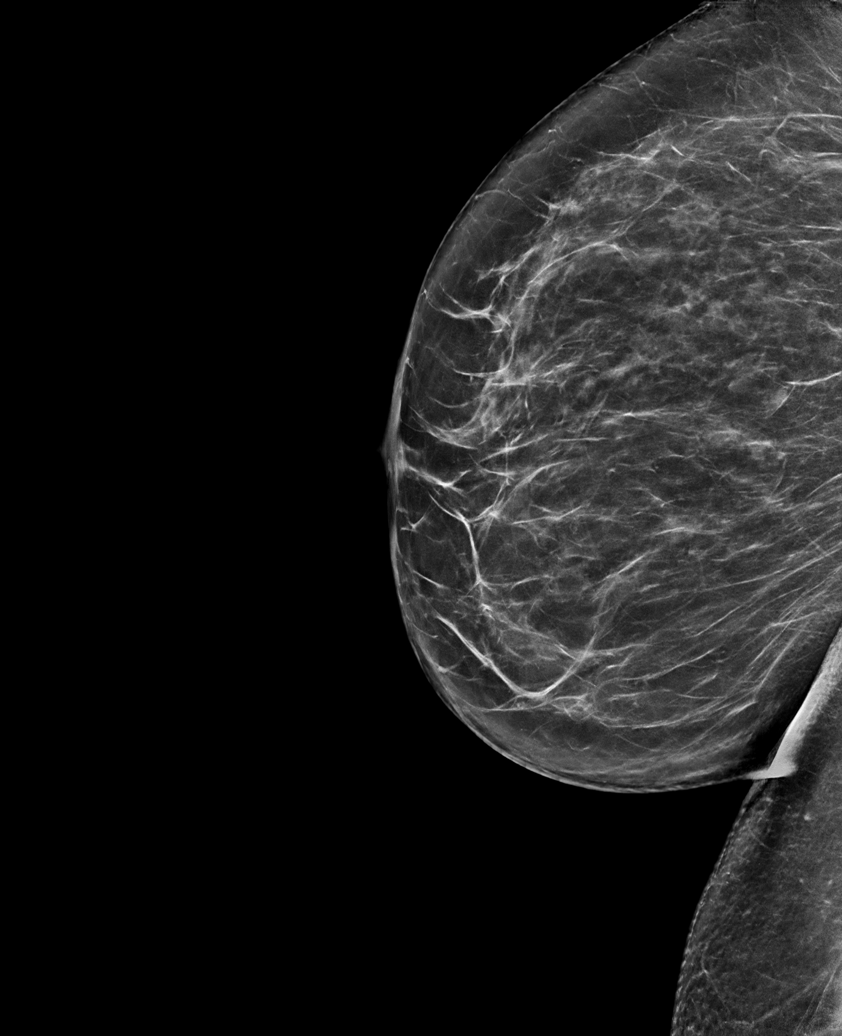

[R CC synth-2D]
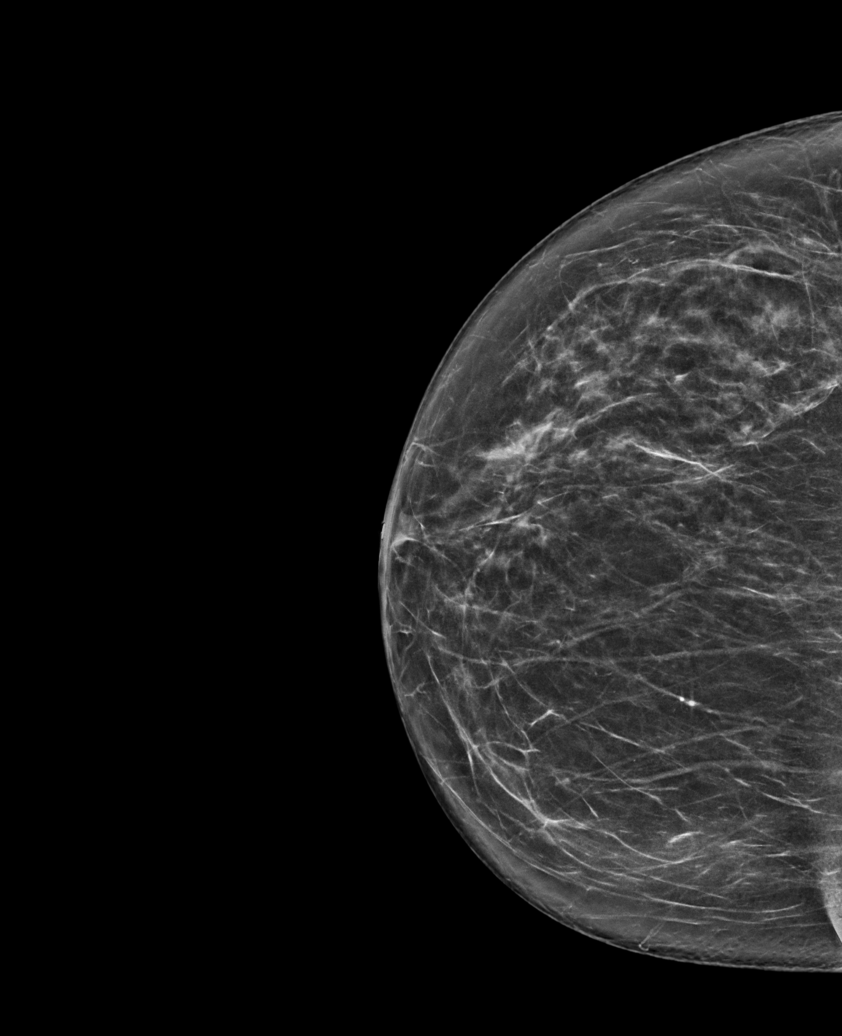

[L MLO synth-2D]
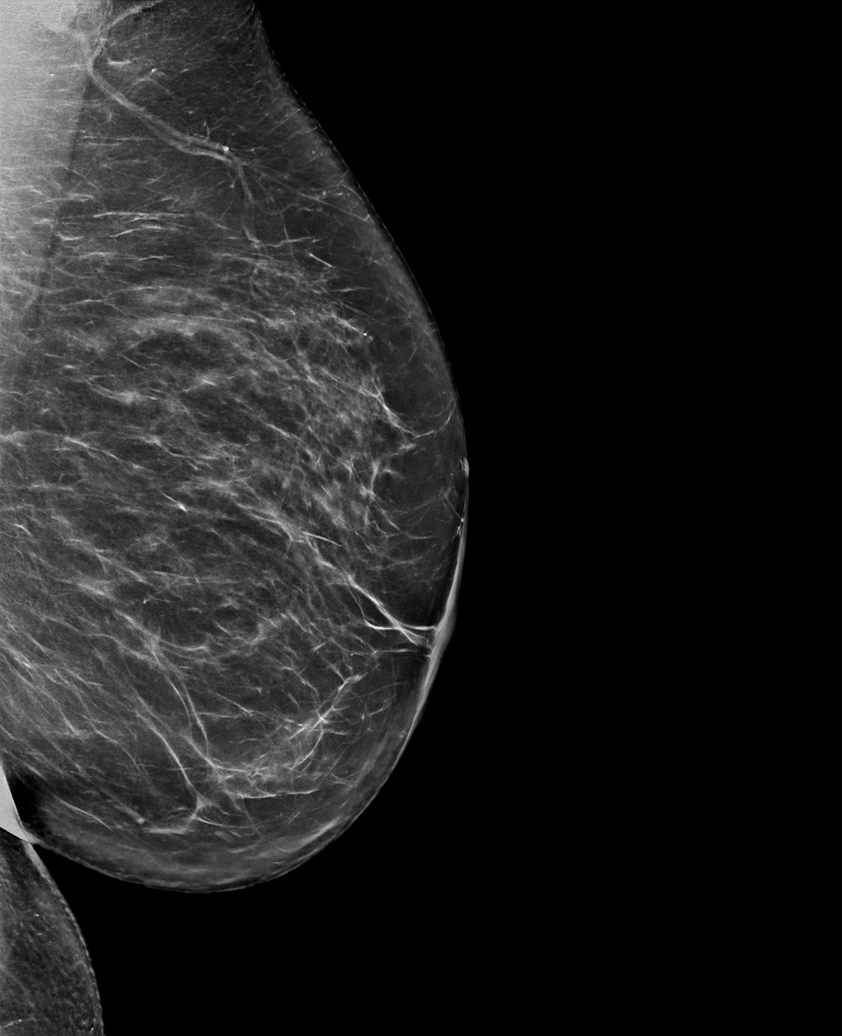

[R MLO synth-2D (2 of 2)]
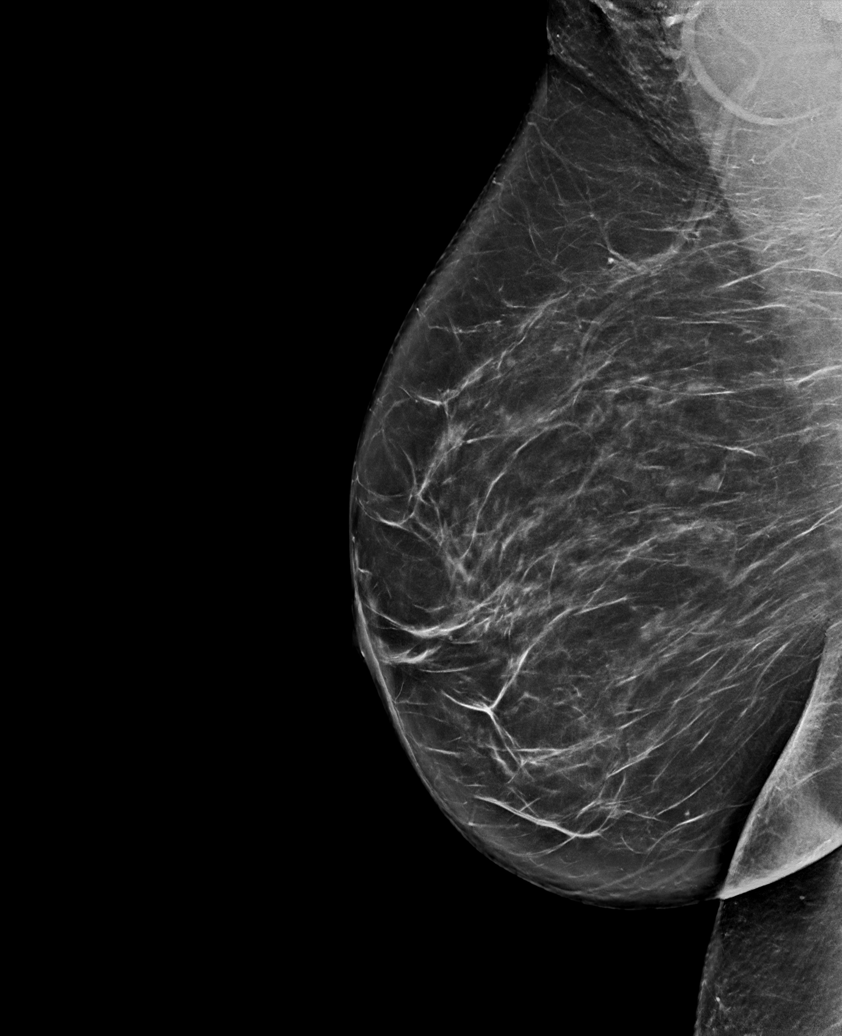

[R CC tomo · tomo slice 37/73.0]
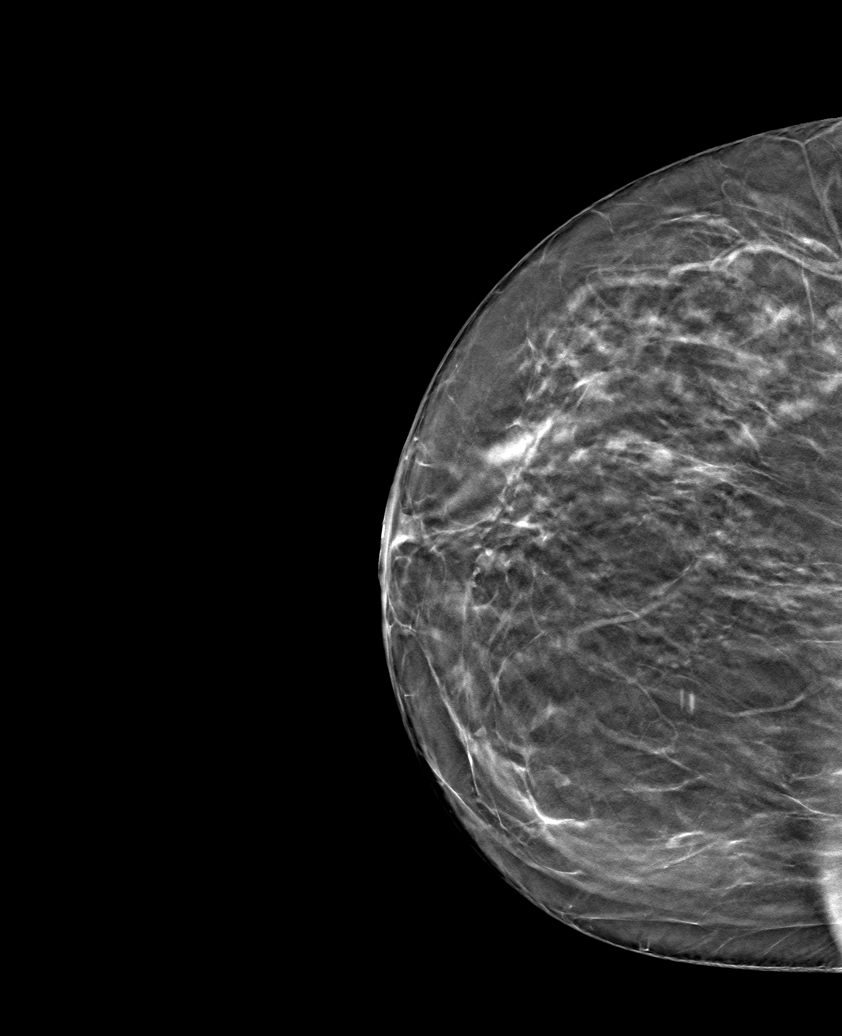

[6 of 30 positions shown; findings below may reference images not displayed]

ACR Breast Density Category b: There are scattered areas of
fibroglandular density.
FINDINGS: There are no findings suspicious for malignancy. The images were
evaluated with computer-aided detection.
IMPRESSION: No mammographic evidence of malignancy. A result letter of this
screening mammogram will be mailed directly to the patient.

RECOMMENDATION:
Screening mammogram in one year. (Code:WJ-I-BG6)

BI-RADS CATEGORY  1: Negative.

## 2023-04-14 ENCOUNTER — Other Ambulatory Visit: Payer: Self-pay | Admitting: Physician Assistant

## 2023-04-14 DIAGNOSIS — N6320 Unspecified lump in the left breast, unspecified quadrant: Secondary | ICD-10-CM

## 2023-04-14 DIAGNOSIS — R928 Other abnormal and inconclusive findings on diagnostic imaging of breast: Secondary | ICD-10-CM
